# Patient Record
Sex: Female | Born: 1970 | Race: White | Hispanic: No | Smoking: Never smoker
Health system: Southern US, Community
[De-identification: ages and names within clinical notes are randomized; demographics above are authoritative.]

## PROBLEM LIST (undated history)

## (undated) DIAGNOSIS — G43909 Migraine, unspecified, not intractable, without status migrainosus: Secondary | ICD-10-CM

## (undated) DIAGNOSIS — T7840XA Allergy, unspecified, initial encounter: Secondary | ICD-10-CM

## (undated) DIAGNOSIS — R748 Abnormal levels of other serum enzymes: Secondary | ICD-10-CM

## (undated) DIAGNOSIS — R519 Headache, unspecified: Secondary | ICD-10-CM

## (undated) DIAGNOSIS — E785 Hyperlipidemia, unspecified: Secondary | ICD-10-CM

## (undated) DIAGNOSIS — R002 Palpitations: Secondary | ICD-10-CM

## (undated) HISTORY — DX: Palpitations: R00.2

## (undated) HISTORY — DX: Allergy, unspecified, initial encounter: T78.40XA

## (undated) HISTORY — DX: Abnormal levels of other serum enzymes: R74.8

## (undated) HISTORY — DX: Hyperlipidemia, unspecified: E78.5

## (undated) HISTORY — PX: LASIK: SHX215

## (undated) HISTORY — DX: Headache, unspecified: R51.9

## (undated) HISTORY — DX: Migraine, unspecified, not intractable, without status migrainosus: G43.909

## (undated) HISTORY — PX: HERNIA REPAIR: SHX51

---
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
## 2003-03-30 ENCOUNTER — Other Ambulatory Visit: Admission: RE | Admit: 2003-03-30 | Discharge: 2003-03-30 | Payer: Self-pay | Admitting: *Deleted

## 2003-03-30 ENCOUNTER — Other Ambulatory Visit: Admission: RE | Admit: 2003-03-30 | Discharge: 2003-03-30 | Payer: Self-pay | Admitting: Obstetrics and Gynecology

## 2003-04-09 ENCOUNTER — Inpatient Hospital Stay (HOSPITAL_COMMUNITY): Admission: AD | Admit: 2003-04-09 | Discharge: 2003-04-09 | Payer: Self-pay | Admitting: Obstetrics and Gynecology

## 2003-07-30 ENCOUNTER — Inpatient Hospital Stay (HOSPITAL_COMMUNITY): Admission: AD | Admit: 2003-07-30 | Discharge: 2003-07-30 | Payer: Self-pay | Admitting: Obstetrics and Gynecology

## 2003-10-05 ENCOUNTER — Encounter (INDEPENDENT_AMBULATORY_CARE_PROVIDER_SITE_OTHER): Payer: Self-pay | Admitting: Specialist

## 2003-10-05 ENCOUNTER — Inpatient Hospital Stay (HOSPITAL_COMMUNITY): Admission: AD | Admit: 2003-10-05 | Discharge: 2003-10-07 | Payer: Self-pay | Admitting: Obstetrics and Gynecology

## 2004-03-31 ENCOUNTER — Other Ambulatory Visit: Admission: RE | Admit: 2004-03-31 | Discharge: 2004-03-31 | Payer: Self-pay | Admitting: Obstetrics and Gynecology

## 2004-04-27 ENCOUNTER — Ambulatory Visit (HOSPITAL_COMMUNITY): Admission: RE | Admit: 2004-04-27 | Discharge: 2004-04-27 | Payer: Self-pay | Admitting: Surgery

## 2004-04-27 ENCOUNTER — Ambulatory Visit (HOSPITAL_BASED_OUTPATIENT_CLINIC_OR_DEPARTMENT_OTHER): Admission: RE | Admit: 2004-04-27 | Discharge: 2004-04-27 | Payer: Self-pay | Admitting: Surgery

## 2005-04-16 ENCOUNTER — Other Ambulatory Visit: Admission: RE | Admit: 2005-04-16 | Discharge: 2005-04-16 | Payer: Self-pay | Admitting: Obstetrics and Gynecology

## 2006-04-23 ENCOUNTER — Other Ambulatory Visit: Admission: RE | Admit: 2006-04-23 | Discharge: 2006-04-23 | Payer: Self-pay | Admitting: Obstetrics and Gynecology

## 2006-05-14 ENCOUNTER — Other Ambulatory Visit: Admission: RE | Admit: 2006-05-14 | Discharge: 2006-05-14 | Payer: Self-pay | Admitting: Obstetrics and Gynecology

## 2008-07-13 ENCOUNTER — Ambulatory Visit: Payer: Self-pay | Admitting: Internal Medicine

## 2008-07-13 ENCOUNTER — Encounter: Admission: RE | Admit: 2008-07-13 | Discharge: 2008-07-13 | Payer: Self-pay | Admitting: Internal Medicine

## 2009-06-28 ENCOUNTER — Ambulatory Visit: Payer: Self-pay | Admitting: Internal Medicine

## 2009-12-12 ENCOUNTER — Ambulatory Visit: Payer: Self-pay | Admitting: Internal Medicine

## 2010-06-09 ENCOUNTER — Ambulatory Visit (HOSPITAL_COMMUNITY)
Admission: RE | Admit: 2010-06-09 | Discharge: 2010-06-09 | Payer: Self-pay | Source: Home / Self Care | Attending: Psychiatry | Admitting: Psychiatry

## 2010-06-30 ENCOUNTER — Ambulatory Visit
Admission: RE | Admit: 2010-06-30 | Discharge: 2010-06-30 | Payer: Self-pay | Source: Home / Self Care | Attending: Internal Medicine | Admitting: Internal Medicine

## 2010-11-07 ENCOUNTER — Telehealth: Payer: Self-pay | Admitting: Internal Medicine

## 2010-11-07 NOTE — Telephone Encounter (Signed)
I have to leave at noon. Can we fit her in or move someone?

## 2010-11-10 ENCOUNTER — Ambulatory Visit (INDEPENDENT_AMBULATORY_CARE_PROVIDER_SITE_OTHER): Payer: 59 | Admitting: Internal Medicine

## 2010-11-10 ENCOUNTER — Encounter: Payer: Self-pay | Admitting: *Deleted

## 2010-11-10 ENCOUNTER — Encounter: Payer: Self-pay | Admitting: Internal Medicine

## 2010-11-10 VITALS — BP 114/84 | HR 76 | Temp 97.7°F | Wt 132.0 lb

## 2010-11-10 DIAGNOSIS — R002 Palpitations: Secondary | ICD-10-CM

## 2010-11-10 DIAGNOSIS — R000 Tachycardia, unspecified: Secondary | ICD-10-CM

## 2010-11-10 DIAGNOSIS — IMO0001 Reserved for inherently not codable concepts without codable children: Secondary | ICD-10-CM

## 2010-11-10 LAB — CBC WITH DIFFERENTIAL/PLATELET
Basophils Absolute: 0 10*3/uL (ref 0.0–0.1)
Basophils Relative: 1 % (ref 0–1)
Eosinophils Absolute: 0.3 10*3/uL (ref 0.0–0.7)
Eosinophils Relative: 5 % (ref 0–5)
MCH: 31 pg (ref 26.0–34.0)
MCV: 92 fL (ref 78.0–100.0)
Neutrophils Relative %: 54 % (ref 43–77)
Platelets: 367 10*3/uL (ref 150–400)
RDW: 13.5 % (ref 11.5–15.5)

## 2010-11-10 LAB — COMPREHENSIVE METABOLIC PANEL
ALT: 25 U/L (ref 0–35)
AST: 49 U/L — ABNORMAL HIGH (ref 0–37)
Alkaline Phosphatase: 95 U/L (ref 39–117)
Creat: 0.8 mg/dL (ref 0.40–1.20)
Sodium: 141 mEq/L (ref 135–145)
Total Bilirubin: 0.7 mg/dL (ref 0.3–1.2)

## 2010-11-10 LAB — TSH: TSH: 1.257 u[IU]/mL (ref 0.350–4.500)

## 2010-11-10 NOTE — Patient Instructions (Signed)
Start PPI daily for reflux. Cut back on Caffeine consumption. We will arrange for Cardiac and Neuro evals for you. Call if symptoms worsen

## 2010-11-10 NOTE — Discharge Summary (Signed)
NAMEFrancesca Mason                             ACCOUNT NO.:  [id number]   MEDICAL RECORD NO.:  [id number]                   PATIENT TYPE:  INP   LOCATION:  9123                                 FACILITY:  [facility]   PHYSICIAN:  Crist Fat. Rivard, M.D.              DATE OF BIRTH:  12-28-1970   DATE OF ADMISSION:  10/05/2003  DATE OF DISCHARGE:                                 DISCHARGE SUMMARY   ADMISSION DIAGNOSES:  1. Intrauterine pregnancy at term.  2. Spontaneous rupture of the membranes.  3. Breech presentation.  4. Positive group B streptococcus.   DISCHARGE DIAGNOSES:  1. Intrauterine pregnancy at term.  2. Spontaneous rupture of the membranes.  3. Breech presentation.  4. Positive group B streptococcus.  5. Status post primary low transverse cesarean section.  6. Breast feeding.  7. Desires oral contraceptives for birth control.   PROCEDURES THIS ADMISSION:  Primary low transverse cesarean section for  delivery of a viable female infant named Janet Mason who had Apgars of 8 and 8  and weighed 7 pounds 7 ounces, attended in delivery by Dr. Osborn Coho  and Nigel Bridgeman, C.N.M.   HOSPITAL COURSE:  Mrs. [name] is a 40 year old married white female gravida 2  para 1-0-0-1 at 48 and six-sevenths weeks who presented with ruptured  membranes with clear fluid and was found to be in breech presentation.  She  was recommended to proceed with cesarean section and underwent the same for  delivery of a viable female infant who was named Janet Mason, had Apgars of 8  and 8 and weighed 7 pounds 7 ounces, attended in delivery by Dr. Osborn Coho and Nigel Bridgeman, C.N.M.  Please see operative noted for further  details.  Postoperatively the patient is doing well.  She is ambulating,  voiding, and eating without difficulty.  Her vital signs are stable and she  is afebrile.  She is tolerating a regular diet also without difficulty.  She  is breast feeding.  She desires discharge today.   DISCHARGE INSTRUCTIONS:  As per the [facility] handout.   DISCHARGE MEDICATIONS:  1. Motrin 600 mg p.o. q.6h. p.r.n. for pain.  2. Tylox one to two p.o. q.4-6h. p.r.n. for pain.  3. Prenatal vitamins daily.  4. Micronor to start in approximately 3-4 weeks.   DISCHARGE LABORATORY DATA:  She did receive RhoGAM postpartum.  Also, her  hemoglobin is 10.7, her wbc count is 10.3, and her platelets are 221.   DISCHARGE FOLLOW-UP:  In 4-6 weeks at [facility].   DISCHARGE CONDITION:  Stable.     Concha Pyo. Duplantis, C.N.M.              Crist Fat Rivard, M.D.    SJD/MEDQ  D:  10/07/2003  T:  10/07/2003  Job:  [id number]

## 2010-11-10 NOTE — Op Note (Signed)
NAMEFrancesca Mason                 ACCOUNT NO.:  [id number]   MEDICAL RECORD NO.:  [id number]          PATIENT TYPE:  AMB   LOCATION:  DSC                          FACILITY:  [facility]   PHYSICIAN:  Velora Heckler, MD      DATE OF BIRTH:  [birth year]   DATE OF PROCEDURE:  04/27/2004  DATE OF DISCHARGE:                                 OPERATIVE REPORT   REFERRING PHYSICIAN:  Osborn Coho, M.D.   PREOPERATIVE DIAGNOSIS:  Incisional hernia.   POSTOPERATIVE DIAGNOSIS:  Incisional hernia.   PROCEDURE:  Repair incisional hernia with polyester mesh.   SURGEON:  Velora Heckler, M.D.   ANESTHESIA:  General anesthesia per Kaylyn Layer. Michelle Piper, M.D.   ESTIMATED BLOOD LOSS:  Minimal.   PREPARATION:  Betadine.   COMPLICATIONS:  None.   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  The patient first noted a bulge in the lower abdominal wall  July 2005, after resuming her exercise routine.  This gradually increased in  size.  She had minor discomfort.  She now comes to surgery for repair.   DESCRIPTION OF PROCEDURE:  Procedure is done in OR #6 at the [facility].  The patient is brought to the operating  room and placed in the supine position on the operating room table.  Following administration of general anesthesia, the patient was prepped and  draped in the usual strict aseptic fashion.  After ascertaining that an  adequate level of anesthesia had been obtained, a low midline abdominal  incision is made with a #15 blade.  Dissection is  carried down through  subcutaneous tissues and hemostasis obtained with the electrocautery.  Fascial plane is developed.  There are numerous weak, thin and herniated  areas in the abdominal midline.  These are carefully opened along  approximately an 8 cm extent.  Attenuated fascial tissue is debrided.  Adhesions to the anterior abdominal wall are taken down with the  electrocautery.  Fascia is then closed in the midline with interrupted 0  Ethibond suture.  Next, a sheet of polyester mesh is cut to the appropriate  dimensions and placed as an onlay patch onto the anterior fascia.  This is  secured circumferentially with 0 Ethibond sutures.  Good hemostasis is  noted. Good approximation of the mesh to the anterior fascia is noted.  Subcutaneous tissues are  closed with interrupted 3-0 Vicryl sutures.  Skin is closed with a running 4-  0 Vicryl subcuticular suture.  Wound is washed and dried and Benzoin and  Steri-Strips are applied.  Sterile dressings are applied.  The patient is  awakened from anesthesia and brought to the recovery room in stable  condition.  The patient tolerated the procedure well.      Todd   TMG/MEDQ  D:  04/27/2004  T:  04/27/2004  Job:  [id number]   cc:   Antony Madura, M.D.  [address]  [place] [address]  Fax: [phone]   Osborn Coho, M.D.  Fax: [phone]

## 2010-11-10 NOTE — Progress Notes (Signed)
  Subjective:    Patient ID: Janet Mason, female    DOB: 1971-03-26, 40 y.o.   MRN: [id number]  HPIDr. 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 Her general health is excellent except for some mild hyperlipidemia which is controlled by diet. She has had recently some palpitations for a few seconds, not sure if rhythm is regular or not and some tightness in her throat. Does drink about 16oz of home made [employer] coffee q am. Some history of mild reflux symptoms from time to time but not on meds for that.  Also, has noticed some odd twitches in both hands arms legs and trunk. She does do Yoga and recently has done some weight lifting. Has been somewhat concerned about some possible weakness in her shoulders compared to other yoga students. No neck pain and no radiculopathy. No family hx of MS or ALS. Father was hospitalized at age 8 with PVCs and put on a beta blocker. Some years later, he had an MI.    Review of Systems     Denies fatigue, unusual stress, does not take OTC decongestants or smoke. No neck pain. No leg weakness. Not sure if twitches are associated with any fasciculations or not. 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 They are spontaneous, not repetitive in same body part but she did note repetition in left palm. Objective:   Physical Exam   No tremors, fasciculations,twitching noted in any body part during exam. Neck supple without thyromegaly, No bruits. Chest clear. Cor RRR No Murmur, no clicks, nl S1&S2 Abd- no hepatosplenomegaly, Masses or tenderness. Ext without edema. Neuro: muscle strength 5/5 in UEs and LEs. DTRs 2+ and =. No facial assymetry. EOM's full.    Assessment & Plan:  1- Palpitations with Family Hx in father of PVCs. Today's EKG shows sinus rhythm and no ectopy. Caffeine consumption could be a factor as well as reflux. Will decrease  caffeine consumption by  half.  Cardiology evaluation.  Pt will be starting PPI for reflux symptoms. 2-Muscle twitching- maybe benign. R/O subtle demyelinating disease presentation, myositis, etc. Labs pending include:CBC, sed rate, ANA, Total CK, Cmet. Also, [facility]

## 2010-11-10 NOTE — Op Note (Signed)
NAMEFrancesca Mason                             ACCOUNT NO.:  [id number]   MEDICAL RECORD NO.:  [id number]                   PATIENT TYPE:  INP   LOCATION:  9123                                 [facility]ACILITY:  [facility]   PHYSICIAN:  Osborn Coho, M.D.                DATE OF BIRTH:  01/11/1971   DATE OF PROCEDURE:  10/05/2003  DATE OF DISCHARGE:                                 OPERATIVE REPORT   PREOPERATIVE DIAGNOSES:  1. Term intrauterine pregnancy.  2. Spontaneous rupture of membranes.  3. Breech.   POSTOPERATIVE DIAGNOSES:  1. Term intrauterine pregnancy.  2. Spontaneous rupture of membranes.  3. Breech.   PROCEDURE:  Primary low transverse cesarean section via Pfannenstiel skin  incision.   ANESTHESIA:  Spinal.   ATTENDING:  Osborn Coho, M.D.   ASSISTANT:  Renaldo Reel. Emilee Hero, C.N.M.   FLUIDS REPLACED:  1700 mL.   URINE OUTPUT:  450 mL.   ESTIMATED BLOOD LOSS:  700 mL.   COMPLICATIONS:  None.   FINDINGS:  A live female infant with Apgars of 8 at one minute, 8 at five  minutes.  Normal to palpation, normal bilateral ovaries and tubes.   PROCEDURE:  The patient was taken to the operating room after the risks,  benefits, and alternatives discussed with the patient.  Patient verbalized  understanding and consent signed and witnessed.  The patient was given a  spinal per anesthesia and prepped and draped in the normal sterile fashion.  A Pfannenstiel skin incision was made and carried down to the underlying  layer of fascia with the Bovie.  The fascia was excised bilaterally in the  midline and extended bilaterally with the Mayo scissors.  Kocher clamps were  placed on the superior aspect of the fascial incision and the rectus muscle  excised from the fascia.  The same was done on the inferior aspect of the  fascial incision.  The muscle was separated in the midline and the  peritoneum entered bluntly.  The peritoneum was retracted manually.  The  bladder blade was placed and  bladder flap created with the Metzenbaum  scissors.  The uterine incision was then made with a scalpel after replacing  the bladder blade to protect the bladder.  The uterine incision was then  extended bilaterally with the bandage scissors.  The infant's bilateral  lower extremities were immediately palpated after forewaters ruptured.  The  fluid was clear.  The infant was delivered via breech presentation without  difficulty.  The cord was clamped and cut and the infant handed to the  awaiting pediatricians.  The placenta was removed via fundal massage.  The  uterus was then cleared of all clots and debris.  The uterine incision was  repaired with 0 Vicryl in a running locked fashion.  A second imbricating  layer was performed.  The intra-abdominal cavity was irrigated and the  gutter cleared of  all clots and debris bilaterally.  By palpation the  bilateral ovaries and fallopian tubes were normal.  The uterine incision was  noted to be hemostatic.  The peritoneum was closed with 3-0 chromic in a  running fashion.  The fascia was repaired with 0 Vicryl in a running  fashion.  The subcutaneous tissue was irrigated and made hemostatic with the  Bovie.  A subcuticular stitch was placed to reapproximate the skin using 4-0  Monocryl.  Sponge, lap, and needle count was correct.  The patient tolerated  the procedure well and was returned to the recovery room in stable  condition.                                               Osborn Coho, M.D.    AR/MEDQ  D:  10/06/2003  T:  10/06/2003  Job:  [id number]

## 2010-11-10 NOTE — H&P (Signed)
NAMEFrancesca Mason                             ACCOUNT NO.:  [id number]   MEDICAL RECORD NO.:  [id number]                   PATIENT TYPE:  INP   LOCATION:  9123                                 [facility]ACILITY[facility]   PHYSICIAN:  Osborn Coho, M.D.                DATE OF BIRTH:  October 20, 1970   DATE OF ADMISSION:  10/05/2003  DATE OF DISCHARGE:                                HISTORY & PHYSICAL   HISTORY:  Ms. Janet Mason is a 40 year old, gravida 2, para 1-0-0-1 at 33 6/7  weeks who presented with spontaneous rupture of membranes at approximately 3  a.m. with clear fluid noted and irregular uterine contractions.  The cervix  has been 1 cm on her last exam.  She presented for admission at  approximately 8:30. The pregnancy has been remarkable for 1) positive group  B strep, 2) Rh negative, 3) history of cryo, 4) irregular cycles, 5) history  of polyhydramnios/pregnancy, 6) history of infertility, 7) first trimester  spotting.   PRENATAL LABS:  Blood type is O negative, Rh antibody negative, VDRL  nonreactive, rubella titer positive, hepatitis B surface antigen negative.  HIV negative, GC and chlamydia cultures are negative.  Pap was normal.  Glucose challenge was normal.  Hemoglobin upon entering the practice was  13.7, it was 11.7 at 28 weeks.  Group B strep culture was positive at 36  weeks, GC and chlamydia cultures were negative. EDC of October 20, 2003 was  established by ultrasound at approximately 8 weeks and in agreement with  ultrasound at approximately 18 weeks.   HISTORY OF PRESENT PREGNANCY:  The patient entered care at approximately 10  weeks.  She had had an ultrasound at 8 weeks secondary to questionable LMP  and irregular cycles. She had some spotting at 12 weeks and received RhoGAM.  She had an ultrasound for cervical length at 14 weeks which showed normal  length at 3.0.  Her AFP was normal.  She had another ultrasound at 19 weeks  that showed normal growth and development. She had  a normal Glucola and  received RhoGAM.  She was referred to marital counseling at 28 weeks but  denied any abuse which seemed to benefit her. She had an upper respiratory  infection at 33 weeks. She was placed on a Z-pack. The rest of her pregnancy  was essentially uncomplicated.   OBSTETRICAL HISTORY:  In 2003, she had a vaginal birth of a female infant,  weight 7 pounds 1 ounce at 40 4/7 weeks. She was in labor for 11 hours.  She  had an epidural and had to have it placed twice.  She did have  polyhydramnios and was Rh negative and received RhoGAM.  She had an abnormal  1 hour GTT but had a normal 3 hour. She have polyhydramnios with her  previous pregnancy, she had RhoGAM with her previous pregnancy. She was on  Ortho  Tri-Cyclen and Micronor in the past. She had an abnormal Pap in 1991  and had cryosurgery. She had infertility treatments prior to her previous  pregnancy. She reports the usual childhood illnesses.  She had mild anemia  in the past.  She had one UTI in the past.   PAST SURGICAL HISTORY:  Wisdom teeth removed in 1989 and LASIK surgery for  eyes in 2002. She did have ovarian cyst and hyperstimulation syndrome of  ovaries after Clomid was given in 2002 but this did not require any surgery.  She also had the previously  noted cryosurgery in 1991.   ALLERGIES:  She has no known medication allergies.   FAMILY HISTORY:  Father and first cousins have heart disease, mother and  father have hypertension. Diabetes runs in her extended family.  Maternal  grandmother has rheumatoid arthritis. Genetic history is unremarkable.   SOCIAL HISTORY:  The patient is married to the father of the baby, he is  involved and supportive.  His name is Craige Cotta. 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 She has been followed by the physician  service at [facility], she denies any alcohol,  drug or tobacco use  during this pregnancy. She did have some issues of depression and marital  discord but these were not related to domestic violence.   PHYSICAL EXAMINATION:  VITAL SIGNS:  Stable, the patient is afebrile.  HEENT:  Within normal limits.  LUNGS:  Bilateral breath sounds are clear.  HEART:  Regular rate and rhythm without murmur.  BREASTS:  Nontender.  ABDOMEN:  Fundal height is approximately 37 cm.  Estimated fetal weight is 6-  7 pounds. Uterine contractions are irregular and mild.  A speculum exam  reveals positive pooling, positive nitrazine with copious clear fluid noted.  The cervix is 1 cm, 60% with presenting part -2 plus station.  Bedside  ultrasound reveals the fetus is in a breech presentation with the fetal head  in the right upper quadrant.  Fetal heart rate is reassuring, there are some  very mild variables but variability is good and there are some accelerations  noted.  There are no light decelerations noted. No evidence of prolapsed  cord is noted or meconium.  EXTREMITIES:  Deep tendon reflexes are 2+ without clonus, there is trace  edema noted.   IMPRESSION:  1. Intrauterine pregnancy at 37 6/7 week .  2. Spontaneous rupture of membranes with minimal labor.  3. Positive group B strep.  4. Breech presentation.   PLAN:  1. Admit to birthing suite for consult with Dr. Osborn Coho as attending     physician.  2. Routine physician preop orders.  3. A cesarean section as planned with risks and benefits reviewed with the     patient and her husband including bleeding, infection and damage to other     organs. The patient and her husband do wish to proceed with cesarean     section.  This will be done with OR availability.     Renaldo Reel Emilee Hero, C.N.M.                   Osborn Coho, M.D.    VLL/MEDQ  D:  10/05/2003  T:  10/05/2003  Job:  [id number]

## 2010-11-11 LAB — CK TOTAL AND CKMB (NOT AT ARMC)
CK, MB: 6.3 ng/mL — ABNORMAL HIGH (ref 0.3–4.0)
Relative Index: 0.2 (ref 0.0–2.5)
Total CK: 2755 U/L — ABNORMAL HIGH (ref 7–177)

## 2010-11-14 LAB — ANA: Anti Nuclear Antibody(ANA): NEGATIVE

## 2010-11-21 ENCOUNTER — Telehealth: Payer: Self-pay | Admitting: Internal Medicine

## 2010-11-28 NOTE — Telephone Encounter (Signed)
Pt came in for additional labs

## 2010-12-12 ENCOUNTER — Ambulatory Visit (INDEPENDENT_AMBULATORY_CARE_PROVIDER_SITE_OTHER): Payer: 59 | Admitting: Internal Medicine

## 2010-12-12 ENCOUNTER — Encounter: Payer: Self-pay | Admitting: Internal Medicine

## 2010-12-12 VITALS — BP 113/75 | HR 60 | Resp 14 | Ht 64.0 in | Wt 133.0 lb

## 2010-12-12 DIAGNOSIS — R002 Palpitations: Secondary | ICD-10-CM | POA: Insufficient documentation

## 2010-12-12 NOTE — Progress Notes (Signed)
  HPI  Janet Mason is a 40 y.o. female Seen at the request of Dr. Lenord Fellers because of palpitations.  She has a history of hypertension and that has been described as PVCs. These were quite distinct. About a month ago she had some problems with muscle twitching and the sensation that her heart was beating fast. There was some irregularity to it. She went and had blood work drawn. This demonstrated a CK of 2700. She felt was seen for myositis as well as neurologic disease. Apparently this has all been negative. She was treated with a PPI and vacation and her symptoms have largely resolved.  She has no history of exercise intolerance.  No past medical history on file. our  No past surgical history on file.  Current Outpatient Prescriptions  Medication Sig Dispense Refill  . Calcium Carbonate (CALCIUM 500 PO) Take by mouth daily.        Marland Kitchen levonorgestrel (MIRENA) 20 MCG/24HR IUD 1 each by Intrauterine route once.        Marland Kitchen DISCONTD: Norethin Ace-Eth Estrad-FE (LOESTRIN FE 1/20 PO) Take by mouth daily.          No Known Allergies  Review of Systems negative except from HPI and PMH GE reflux and allergic diathesis  Physical Exam Well developed and well nourished Caucasian female appearing her stated age in no acute distress HENT normal E scleral and icterus clear Neck Supple JVP flat; carotids brisk and full Clear to ausculation Regular rate and rhythm, no murmurs gallops or rub Soft with active bowel sounds No clubbing cyanosis and edema Alert and oriented, grossly normal motor and sensory function Skin Warm and Dry  ECG  Assessment and  Plan

## 2010-12-12 NOTE — Assessment & Plan Note (Signed)
The patient has had 2 palpitations syndrome one has been long-standing and are consistent with PVCs the other occurred in the context of this illness characterized by muscle twitching elevated CK enzyme and had been largely resolving as her CKs come down. I not sure what this is. He asked his of exercise intolerance, however, I don't think there is any value in pursuing an echo cardiogram at this point. We will plan to see her`as needed

## 2010-12-19 ENCOUNTER — Other Ambulatory Visit: Payer: Self-pay | Admitting: Internal Medicine

## 2010-12-19 DIAGNOSIS — Z1231 Encounter for screening mammogram for malignant neoplasm of breast: Secondary | ICD-10-CM

## 2011-01-23 ENCOUNTER — Ambulatory Visit
Admission: RE | Admit: 2011-01-23 | Discharge: 2011-01-23 | Disposition: A | Discharge status: Home / Self Care | Payer: 59 | Source: Ambulatory Visit | Attending: Internal Medicine | Admitting: Internal Medicine

## 2011-01-23 DIAGNOSIS — Z1231 Encounter for screening mammogram for malignant neoplasm of breast: Secondary | ICD-10-CM

## 2011-01-24 ENCOUNTER — Encounter: Payer: Self-pay | Admitting: Internal Medicine

## 2011-05-07 ENCOUNTER — Telehealth: Payer: Self-pay | Admitting: Internal Medicine

## 2011-05-07 MED ORDER — FLUTICASONE PROPIONATE 50 MCG/ACT NA SUSP: 1.0000 | Freq: Every day | NASAL | Status: DC

## 2011-05-07 NOTE — Telephone Encounter (Signed)
Patient aware rx called to pharmacy  

## 2011-05-07 NOTE — Telephone Encounter (Signed)
Please call in generic Flonase for Dr. Charlton Haws- 2 sprays each nostril daily with prn one year refills.

## 2011-07-26 ENCOUNTER — Other Ambulatory Visit: Payer: 59 | Admitting: Internal Medicine

## 2011-07-27 ENCOUNTER — Encounter: Payer: 59 | Admitting: Internal Medicine

## 2011-08-07 ENCOUNTER — Other Ambulatory Visit: Payer: Self-pay | Admitting: Internal Medicine

## 2011-08-07 ENCOUNTER — Other Ambulatory Visit: Payer: 59 | Admitting: Internal Medicine

## 2011-08-07 DIAGNOSIS — Z0000 Encounter for general adult medical examination without abnormal findings: Secondary | ICD-10-CM

## 2011-08-07 LAB — CBC WITH DIFFERENTIAL/PLATELET
Basophils Absolute: 0 10*3/uL (ref 0.0–0.1)
Eosinophils Absolute: 0.2 10*3/uL (ref 0.0–0.7)
Eosinophils Relative: 3 % (ref 0–5)
Lymphocytes Relative: 27 % (ref 12–46)
MCV: 93.4 fL (ref 78.0–100.0)
Neutrophils Relative %: 64 % (ref 43–77)
Platelets: 302 10*3/uL (ref 150–400)
RDW: 13.1 % (ref 11.5–15.5)
WBC: 6.9 10*3/uL (ref 4.0–10.5)

## 2011-08-07 LAB — LIPID PANEL
Total CHOL/HDL Ratio: 2.6 Ratio
VLDL: 10 mg/dL (ref 0–40)

## 2011-08-07 LAB — COMPREHENSIVE METABOLIC PANEL
ALT: 12 U/L (ref 0–35)
AST: 15 U/L (ref 0–37)
Chloride: 104 mEq/L (ref 96–112)
Creat: 0.79 mg/dL (ref 0.50–1.10)
Total Bilirubin: 0.7 mg/dL (ref 0.3–1.2)

## 2011-08-07 LAB — TSH: TSH: 1.159 u[IU]/mL (ref 0.350–4.500)

## 2011-08-08 LAB — VITAMIN D 25 HYDROXY (VIT D DEFICIENCY, FRACTURES): Vit D, 25-Hydroxy: 30 ng/mL (ref 30–89)

## 2011-08-10 ENCOUNTER — Encounter: Payer: Self-pay | Admitting: Internal Medicine

## 2011-08-10 ENCOUNTER — Ambulatory Visit (INDEPENDENT_AMBULATORY_CARE_PROVIDER_SITE_OTHER): Payer: 59 | Admitting: Internal Medicine

## 2011-08-10 VITALS — BP 114/72 | HR 68 | Temp 97.6°F | Ht 64.5 in | Wt 136.0 lb

## 2011-08-10 DIAGNOSIS — Z0000 Encounter for general adult medical examination without abnormal findings: Secondary | ICD-10-CM

## 2011-08-10 LAB — POCT URINALYSIS DIPSTICK
Blood, UA: NEGATIVE
Nitrite, UA: NEGATIVE
Urobilinogen, UA: NEGATIVE
pH, UA: 6.5

## 2011-09-22 NOTE — Patient Instructions (Signed)
Return in one year or as needed. 

## 2011-09-22 NOTE — Progress Notes (Signed)
  Subjective:    Patient ID: Janet Mason, female    DOB: 12-Dec-1970, 41 y.o.   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 Has GYN physician. No real complaints or problems. No known drug allergies. Had tetanus immunization 07/13/2008. Gets annual influenza immunization. Had a C-section 2005. Incisional hernia repair 2005. Lasix eye surgery 2000. History of fractured left hand and wrist after a fall 2007. History of fractured right arm at age 61. Patient is on oral contraceptives.  Family history: Father died at age 27 of a sudden cardiac arrest. Mother with history of diabetes. 2 brothers one of which has increased lipids and hypertension.  Patient had menarche at HTN. Has gynecologist.  Patient is a nonsmoker. Social alcohol consumption. She has 2 daughters from her first marriage to an ER physician. She has subsequently remarried and is happy.  In 2011 [facility]. Patient was thought to have migraine with aura.    Review of Systems history of high HDL (good) cholesterol of 82. Otherwise noncontributory     Objective:   Physical Exam  Vitals reviewed. Constitutional: She is oriented to person, place, and time. She appears well-nourished. No distress.  HENT:  Head: Normocephalic and atraumatic.  Right Ear: External ear normal.  Left Ear: External ear normal.  Nose: Nose normal.  Mouth/Throat: Oropharynx is clear and moist. No oropharyngeal exudate.  Eyes: EOM are normal. Right eye exhibits no discharge. Left eye exhibits no discharge. No scleral icterus.  Neck: No JVD present. No thyromegaly present.  Cardiovascular: Normal rate, regular rhythm, normal heart sounds and intact distal pulses.   No murmur heard. Pulmonary/Chest: Effort normal and breath sounds normal. She has no rales.       Breasts normal female  Abdominal: Soft. Bowel sounds are normal.  She exhibits no distension and no mass. There is no tenderness. There is no rebound and no guarding.  Genitourinary:       Deferred  Musculoskeletal: Normal range of motion. She exhibits no edema.  Lymphadenopathy:    She has no cervical adenopathy.  Neurological: She is alert and oriented to person, place, and time. She has normal reflexes. No cranial nerve deficit. Coordination normal.  Skin: Skin is warm and dry. No rash noted. She is not diaphoretic.  Psychiatric: She has a normal mood and affect. Her behavior is normal. Judgment and thought content normal.          Assessment & Plan:  Normal health maintenance exam  Plan: Return in one year or as needed.

## 2011-12-31 ENCOUNTER — Other Ambulatory Visit: Payer: Self-pay | Admitting: Internal Medicine

## 2011-12-31 DIAGNOSIS — Z1231 Encounter for screening mammogram for malignant neoplasm of breast: Secondary | ICD-10-CM

## 2012-01-25 ENCOUNTER — Ambulatory Visit: Payer: 59

## 2012-02-08 ENCOUNTER — Ambulatory Visit
Admission: RE | Admit: 2012-02-08 | Discharge: 2012-02-08 | Disposition: A | Discharge status: Home / Self Care | Payer: 59 | Source: Ambulatory Visit | Attending: Internal Medicine | Admitting: Internal Medicine

## 2012-02-08 ENCOUNTER — Encounter: Payer: Self-pay | Admitting: Internal Medicine

## 2012-02-08 ENCOUNTER — Ambulatory Visit (INDEPENDENT_AMBULATORY_CARE_PROVIDER_SITE_OTHER): Payer: 59 | Admitting: Internal Medicine

## 2012-02-08 VITALS — BP 114/72 | HR 68 | Temp 97.2°F | Ht 64.5 in | Wt 133.0 lb

## 2012-02-08 DIAGNOSIS — J189 Pneumonia, unspecified organism: Secondary | ICD-10-CM

## 2012-02-08 DIAGNOSIS — Z1231 Encounter for screening mammogram for malignant neoplasm of breast: Secondary | ICD-10-CM

## 2012-02-08 MED ORDER — CEFTRIAXONE SODIUM 1 G IJ SOLR
1.0000 g | Freq: Once | INTRAMUSCULAR | Status: AC
  Administered 2012-02-08: 1 g via INTRAMUSCULAR

## 2012-02-08 NOTE — Patient Instructions (Addendum)
Take Avelox 400 mg daily with a meal. Use Advair inhaler 100/50 every 12 hours for shortness of breath as needed. Return in one week.

## 2012-02-08 NOTE — Progress Notes (Signed)
  Subjective:    Patient ID: Janet Mason, female    DOB: October 02, 1970, 41 y.o.   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 She started on Omnicef 300 mg twice daily and has been on that now for 11 doses. Has had some shortness of breath and thinks she may have had some wheezing. Says it's hard to get a deep breath. Pulse oximetry today on room air is 99%. No history of asthma. Says cough is now productive but she doesn't know color of sputum. No shaking chills. Cough has become annoying to her. Has been working this past week. Is off work today.    Review of Systems     Objective:   Physical Exam Pharynx is slightly injected without exudate; TMs are slightly full bilaterally but not red; Neck is supple without adenopathy; Chest exam: musical rales and rhonchi noted right lower lobe; cardiac exam regular rate and rhythm; skin pale warm and dry        Assessment & Plan:  Chest x-ray consistent with  right lower lobe pneumonia  Impression: Right lower lobe pneumonia  Plan: Rocephin 1 g IM. Advair 100/50 one spray every 12 hours, Avelox 400 mg daily with a meal for 6 doses. Return in one week. Advise rest at home. Plan to repeat chest x-ray in 2 weeks.

## 2012-02-15 ENCOUNTER — Ambulatory Visit (INDEPENDENT_AMBULATORY_CARE_PROVIDER_SITE_OTHER): Payer: 59 | Admitting: Internal Medicine

## 2012-02-15 ENCOUNTER — Encounter: Payer: Self-pay | Admitting: Internal Medicine

## 2012-02-15 VITALS — BP 104/68 | HR 76 | Temp 98.3°F | Ht 64.5 in | Wt 133.0 lb

## 2012-02-15 DIAGNOSIS — J189 Pneumonia, unspecified organism: Secondary | ICD-10-CM

## 2012-02-22 ENCOUNTER — Ambulatory Visit
Admission: RE | Admit: 2012-02-22 | Discharge: 2012-02-22 | Disposition: A | Discharge status: Home / Self Care | Payer: 59 | Source: Ambulatory Visit | Attending: Internal Medicine | Admitting: Internal Medicine

## 2012-02-22 DIAGNOSIS — J189 Pneumonia, unspecified organism: Secondary | ICD-10-CM

## 2012-02-23 ENCOUNTER — Encounter: Payer: Self-pay | Admitting: Internal Medicine

## 2012-02-23 NOTE — Progress Notes (Signed)
  Subjective:    Patient ID: Janet Mason, female    DOB: 1971/06/09, 41 y.o.   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 She was placed on Avelox 400 mg daily for 6 days. She's here for followup. She feels better and has returned to work. Slight fatigue. Breathing is better. Less shortness of breath. Appetite okay. No fever or shaking chills.    Review of Systems     Objective:   Physical Exam neck is supple without thyromegaly; chest clear to auscultation; cardiac exam regular rate and rhythm        Assessment & Plan:  Right lower lobe pneumonia-improved  Plan: Followup chest x-ray to be obtained today with further instructions to follow.  Addendum: Chest x-ray shows improvement in right lower lobe pneumonia but not complete clearance. She will have repeat chest x-ray in 4 weeks. I have given her an additional 7 days of Avelox 400 mg daily to take.

## 2012-02-23 NOTE — Patient Instructions (Addendum)
Take Avelox 400 mg daily for an additional 7 days. Please have followup chest x-ray done.

## 2012-02-27 NOTE — Progress Notes (Signed)
Patient aware.

## 2012-03-19 ENCOUNTER — Telehealth: Payer: Self-pay

## 2012-03-19 ENCOUNTER — Encounter (INDEPENDENT_AMBULATORY_CARE_PROVIDER_SITE_OTHER): Payer: Self-pay | Admitting: Surgery

## 2012-03-19 ENCOUNTER — Ambulatory Visit (INDEPENDENT_AMBULATORY_CARE_PROVIDER_SITE_OTHER): Payer: 59 | Admitting: Surgery

## 2012-03-19 VITALS — BP 106/70 | HR 60 | Temp 97.8°F | Resp 18 | Ht 64.0 in | Wt 131.6 lb

## 2012-03-19 DIAGNOSIS — K439 Ventral hernia without obstruction or gangrene: Secondary | ICD-10-CM

## 2012-03-19 DIAGNOSIS — K429 Umbilical hernia without obstruction or gangrene: Secondary | ICD-10-CM

## 2012-03-19 DIAGNOSIS — J189 Pneumonia, unspecified organism: Secondary | ICD-10-CM

## 2012-03-19 NOTE — Progress Notes (Signed)
General Surgery [facility]  Chief Complaint  Patient presents with  . New Evaluation    umbilical and ventral hernia - primary care is Dr. 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 Patient had previously been seen in my practice with an incisional hernia following cesarean section. This was repaired in 2005 and she has had no further difficulties. Patient has had a long-standing umbilical hernia which is largely asymptomatic but visually apparent. In the past several weeks the patient had a chronic cough associated with pneumonia. She noted a small ventral hernia a few centimeters above the level of the umbilicus in the midline. This has now reduced and remains asymptomatic.  Past Medical History  Diagnosis Date  . 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 Elevated CK      Current Outpatient Prescriptions  Medication Sig Dispense Refill  . Calcium Carbonate (CALCIUM 500 PO) Take by mouth daily.        . fluticasone (FLONASE) 50 MCG/ACT nasal spray Place 1 spray into the nose daily.  16 g  11  . levonorgestrel (MIRENA) 20 MCG/24HR IUD 1 each by Intrauterine route once.        . cefdinir (OMNICEF) 300 MG capsule Take 300 mg by mouth 2 (two) times daily.      Marland Kitchen moxifloxacin (AVELOX) 400 MG tablet Take 400 mg by mouth daily.         No Known Allergies   No family history on file.   History   Social History  . Marital Status: Married    Spouse Name: N/A    Number of Children: N/A  . Years of Education: N/A   Social History Main Topics  . Smoking status: Never Smoker   . Smokeless tobacco: None  . Alcohol Use: Yes     wine few times a week  . Drug Use: None  . Sexually Active: None   Other Topics Concern  . None   Social History Narrative  . None     REVIEW OF SYSTEMS - PERTINENT POSITIVES  ONLY: No sign or symptom of intestinal obstruction. No other significant abdominal surgery except as noted above.  EXAM: Filed Vitals:   03/19/12 0911  BP: 106/70  Pulse: 60  Temp: 97.8 F (36.6 C)  Resp: 18    HEENT: normocephalic; pupils equal and reactive; sclerae clear; dentition good; mucous membranes moist NECK:  symmetric on extension; no palpable anterior or posterior cervical lymphadenopathy; no supraclavicular masses; no tenderness CHEST: clear to auscultation bilaterally without rales, rhonchi, or wheezes CARDIAC: regular rate and rhythm without significant murmur; peripheral pulses are full ABDOMEN: soft without distension; bowel sounds present; no mass; no hepatosplenomegaly; small umbilical hernia, probably with incarcerated preperitoneal adipose tissue or omentum; scar above the level of the umbilicus consistent with piercing; no palpable ventral hernia on today's exam either in a standing or a recumbent position. EXT:  non-tender without edema; no deformity NEURO: no gross focal deficits; no sign of tremor   LABORATORY RESULTS: See [facility] (CHL-Epic) for most recent results   RADIOLOGY RESULTS: See [facility] (CHL-Epic) for most recent results   IMPRESSION: #1 small umbilical hernia, possibly with incarcerated adipose tissue, asymptomatic #2 ventral hernia by history, not readily apparent on physical examination today #3 history of incisional hernia at [facility]ann[facility]stiel incision, repaired surgically, no evidence of  recurrence  PLAN: The patient and I discussed management. I provided her with written literature to review. Certainly the defect at the umbilicus and the defect in the midline are quite small. I think her risk of incarceration or strangulation is minimal. Certainly if he should enlarger become progressively more symptomatic, they would be amenable to outpatient surgical repair with mesh patch. We discussed this.  Patient will contact me should  she desire to proceed with repair. Otherwise I think this can be safely observed for the time being.  Patient will return as needed.  Velora Heckler, MD, FACS General & Endocrine Surgery [facility]   Visit Diagnoses: 1. Umbilical hernia   2. Ventral hernia     Primary Care Physician: Margaree Mackintosh, MD

## 2012-03-19 NOTE — Patient Instructions (Signed)
Umbilical Herniorrhaphy A herniorrhaphy is surgery to repair a hernia. A hernia is a gap or weakness in the muscles of your abdomen. Umbilical means that your hernia is in the area around your belly button. You might be able to feel a small bulge in your abdomen where the hernia is. You might also have pain or discomfort. If the hernia is not repaired, the gap could get bigger. Your intestines could get trapped in the gap. This can be painful. It also can lead to other health problems, such as blocked intestines. LET YOUR CAREGIVER KNOW ABOUT:  Any allergies.   All medications you are taking, including:   Herbs, eyedrops, over-the-counter medications and cream   Blood thinners (anticoagulants), aspirin or other drugs that could affect blood clotting.   Use of steroids (by mouth or as creams).   Previous problems with anesthetics, including local anesthetics.   Possibility of pregnancy, if this applies.   Any history of blood clots.   Any history of bleeding or other blood problems.   Previous surgery.   Smoking history.   Other health problems.  RISKS AND COMPLICATIONS  Short-term possibilities include:   Pain.   Excessive bleeding.   Hematoma. This is a pooling of blood under the wound.   Infection at the surgery site or of the mesh.   Numbness at the surgery site.   Swelling and bruising.   Slow healing.   Blood clots.   Intestine or bowel damage. This is rare.   Longer-term possibilities include:   Scarring.   Skin damage.   The need for additional surgery.   Another hernia.  BEFORE THE PROCEDURE  Stop using aspirin and non-steroidal anti-inflammatory drugs (NSAIDs) for pain relief. Also stop taking vitamin E. If possible, do this two weeks in advance.   If you take blood-thinners, ask your healthcare provider when you should stop taking them.   Do not eat or drink for about 8 hours before your surgery.   You might be asked to shower or wash with a  special antibacterial soap before the procedure.   Wear clothes that will be easy to put on after the surgery.   Arrive at least an hour before the surgery, or whenever your surgeon recommends. This will give you time to check in and fill out any needed paperwork.   This surgery is usually an outpatient procedure, so you will be able to go home the same day. Less often people stay overnight in the hospital after the procedure. Ask your healthcare provider what to expect. Either way, make arrangements in advance for someone to drive you home. After an outpatient procedure, you should have someone stay with you overnight.  PROCEDURE Your procedure can be done with traditional surgery. The surgeon opens the abdomen and repairs the hernia. Or, sometimes it can be done with laparoscopic surgery. Then the procedure is done through multiple small incisions, using a camera to guide the repair. Talk with your surgeon about how the hernia will be repaired.  The preparation:   You will change into a hospital gown.   You will be given an IV. A needle will be inserted in your arm. Medication can flow directly into your body through this needle.   You might be given a sedative to help you relax.   You may be given a drug that will put you to sleep during the surgery (general anesthetic). Or, your abdomen will be numbed, and you will be drowsy but awake (local   anesthetic).   For a traditional surgery (sometimes called open surgery):   Once you are pain-free, the surgeon will make a small cut (incision) in your abdomen.   The gap in the muscle wall will be repaired. The surgeon could sew muscle together over the gap. Or, a mesh or soft screen material can be used to strengthen the area. The mesh acts as a scaffolding and the body grows new strong tissue into and around it. This new tissue is what closes the gap of the hernia and prevents it from coming back.   A drain might be put in. Fluid sometimes  collects under the wound as it heals. The drain helps get the fluid out of the area. A drain will probably be used if your hernia is large.   The surgeon will close the incision with small stitches.   For a laparoscopic surgery:   One you are pain-free, the surgeon will make a small incision in your abdomen.   A thin tube with a tiny camera attached to it will be inserted into the abdomen through the incision. What the camera "sees" is projected on a screen in the room. This gives the surgeon a good view of the hernia.   Other small incisions will be made so the surgeon can insert small tools that are used to repair the hernia.   The incisions will be closed with small stitches.  AFTER THE PROCEDURE  You will be stay in a recovery area until the anesthesia has worn off. Your blood pressure and pulse will be checked every so often.   You might be asked to get up and try walking.   Sometimes people can go home the same day. For others, an overnight stay is needed.  HOME CARE INSTRUCTIONS   Take any medication that your surgeon prescribed. Follow the directions carefully. Take all of the medication.   Ask your surgeon whether you can take over-the-counter medicines for pain, discomfort or fever. Do not take aspirin unless your healthcare team says that you should. Aspirin increases the chances of bleeding.   Do not get the incision area wet for the first few days after surgery (or until your surgeon says it is OK).   Avoid lifting heavy objects (more than 10 lbs, 4.5 kilograms) for 6 to 8 weeks after your surgery.   Expect some pain when climbing stairs for several days after surgery.   Avoid sexual activity for a few weeks. It can be uncomfortable or painful.   You should be able to drive within a few days. However, do not drive until you are no longer taking pain medicine. It can make you drowsy. It also can slow your reaction time.   You should be able to resume normal activity in  a few days. When you can return to work will depend on the type of work you do. You can go back to a desk job much sooner than you can return to work that requires physical labor. Talk about this with your healthcare provider.  SEEK MEDICAL CARE IF:   You notice blood or fluid leaking from the wound.   The area around the incision becomes red or swollen.   You are having problems urinating.   You become nauseous or throw up for more than two days after the surgery.   You develop a fever of more than 100.5 F (38.1 C).  SEEK IMMEDIATE MEDICAL CARE IF:  You develop a fever of more than   102.0 F (38.9 C). Document Released: 09/07/2008 Document Revised: 05/31/2011 Document Reviewed: 09/07/2008 [facility] Patient Information 2012 [place], [place].

## 2012-03-20 NOTE — Telephone Encounter (Signed)
Patient scheduled for followup chest xray on Friday 03/21/2012 at [facility]

## 2012-03-21 ENCOUNTER — Ambulatory Visit
Admission: RE | Admit: 2012-03-21 | Discharge: 2012-03-21 | Disposition: A | Discharge status: Home / Self Care | Payer: 59 | Source: Ambulatory Visit | Attending: Internal Medicine | Admitting: Internal Medicine

## 2012-03-21 DIAGNOSIS — J189 Pneumonia, unspecified organism: Secondary | ICD-10-CM

## 2012-03-28 ENCOUNTER — Encounter: Payer: Self-pay | Admitting: Obstetrics and Gynecology

## 2012-03-28 ENCOUNTER — Encounter (INDEPENDENT_AMBULATORY_CARE_PROVIDER_SITE_OTHER): Payer: 59 | Admitting: Obstetrics and Gynecology

## 2012-03-28 NOTE — Progress Notes (Signed)
Contraception Mirena IUD Last pap 05/25/2011 Last Mammo     Pt had to leave secondary to insurance issues.  AEX not due until Nov.

## 2012-06-03 ENCOUNTER — Ambulatory Visit: Payer: 59 | Admitting: Obstetrics and Gynecology

## 2012-06-04 ENCOUNTER — Ambulatory Visit: Payer: 59 | Admitting: Obstetrics and Gynecology

## 2012-06-10 ENCOUNTER — Encounter: Payer: Self-pay | Admitting: Obstetrics and Gynecology

## 2012-06-10 ENCOUNTER — Ambulatory Visit (INDEPENDENT_AMBULATORY_CARE_PROVIDER_SITE_OTHER): Payer: 59 | Admitting: Obstetrics and Gynecology

## 2012-06-10 VITALS — BP 106/62 | Resp 16 | Ht 64.0 in | Wt 134.0 lb

## 2012-06-10 DIAGNOSIS — Z01419 Encounter for gynecological examination (general) (routine) without abnormal findings: Secondary | ICD-10-CM

## 2012-06-10 DIAGNOSIS — Z975 Presence of (intrauterine) contraceptive device: Secondary | ICD-10-CM

## 2012-06-10 DIAGNOSIS — Z124 Encounter for screening for malignant neoplasm of cervix: Secondary | ICD-10-CM

## 2012-06-10 NOTE — Progress Notes (Addendum)
Patient ID: Janet Mason, female   DOB: [birth year], 41 y.o.   MRN: [id number] Contraception Mirena Last pap 2012 wnl Last Mammo 2013 wnl Last Colonoscopy never Last Dexa Scan never Primary MD Eden Emms [place] Abuse at Home none  No complaints  Filed Vitals:   06/10/12 0931  BP: 106/62  Resp: 16   ROS: noncontributory  Physical Examination: General appearance - alert, well appearing, and in no distress Neck - supple, no significant adenopathy Chest - clear to auscultation, no wheezes, rales or rhonchi, symmetric air entry Heart - normal rate and regular rhythm Abdomen - soft, nontender, nondistended, no masses or organomegaly Breasts - breasts appear normal, no suspicious masses, no skin or nipple changes or axillary nodes Pelvic - normal external genitalia, vulva, vagina, cervix, uterus and adnexa, string visible Back exam - no CVAT Extremities - no edema, redness or tenderness in the calves or thighs  A/P Pap today AEX 22yr Had mammo

## 2013-01-05 ENCOUNTER — Other Ambulatory Visit: Payer: 59 | Admitting: Internal Medicine

## 2013-01-05 DIAGNOSIS — Z1329 Encounter for screening for other suspected endocrine disorder: Secondary | ICD-10-CM

## 2013-01-05 DIAGNOSIS — Z13220 Encounter for screening for lipoid disorders: Secondary | ICD-10-CM

## 2013-01-05 DIAGNOSIS — Z0000 Encounter for general adult medical examination without abnormal findings: Secondary | ICD-10-CM

## 2013-01-05 DIAGNOSIS — Z130 Encounter for screening for diseases of the blood and blood-forming organs and certain disorders involving the immune mechanism: Secondary | ICD-10-CM

## 2013-01-05 LAB — COMPREHENSIVE METABOLIC PANEL
Albumin: 4.9 g/dL (ref 3.5–5.2)
CO2: 29 mEq/L (ref 19–32)
Calcium: 9.6 mg/dL (ref 8.4–10.5)
Chloride: 101 mEq/L (ref 96–112)
Glucose, Bld: 80 mg/dL (ref 70–99)
Sodium: 139 mEq/L (ref 135–145)
Total Bilirubin: 0.8 mg/dL (ref 0.3–1.2)
Total Protein: 7.1 g/dL (ref 6.0–8.3)

## 2013-01-05 LAB — TSH: TSH: 1.597 u[IU]/mL (ref 0.350–4.500)

## 2013-01-05 LAB — CBC WITH DIFFERENTIAL/PLATELET
Eosinophils Absolute: 0.2 10*3/uL (ref 0.0–0.7)
Eosinophils Relative: 3 % (ref 0–5)
Hemoglobin: 13.6 g/dL (ref 12.0–15.0)
Lymphs Abs: 2 10*3/uL (ref 0.7–4.0)
MCH: 30.2 pg (ref 26.0–34.0)
MCV: 91.8 fL (ref 78.0–100.0)
Monocytes Relative: 6 % (ref 3–12)
Neutrophils Relative %: 60 % (ref 43–77)
RBC: 4.5 MIL/uL (ref 3.87–5.11)

## 2013-01-05 LAB — LIPID PANEL
HDL: 63 mg/dL (ref >39)
LDL Cholesterol: 115 mg/dL — ABNORMAL HIGH (ref 0–99)
Total CHOL/HDL Ratio: 3 Ratio
VLDL: 12 mg/dL (ref 0–40)

## 2013-01-06 ENCOUNTER — Ambulatory Visit (INDEPENDENT_AMBULATORY_CARE_PROVIDER_SITE_OTHER): Payer: 59 | Admitting: Internal Medicine

## 2013-01-06 ENCOUNTER — Encounter: Payer: Self-pay | Admitting: Internal Medicine

## 2013-01-06 ENCOUNTER — Other Ambulatory Visit: Payer: Self-pay

## 2013-01-06 VITALS — BP 104/68 | HR 80 | Temp 98.3°F | Ht 65.0 in | Wt 133.0 lb

## 2013-01-06 DIAGNOSIS — Z0000 Encounter for general adult medical examination without abnormal findings: Secondary | ICD-10-CM

## 2013-01-06 DIAGNOSIS — Z8669 Personal history of other diseases of the nervous system and sense organs: Secondary | ICD-10-CM

## 2013-01-06 DIAGNOSIS — Z1231 Encounter for screening mammogram for malignant neoplasm of breast: Secondary | ICD-10-CM

## 2013-01-06 DIAGNOSIS — Z789 Other specified health status: Secondary | ICD-10-CM

## 2013-01-06 DIAGNOSIS — Z8719 Personal history of other diseases of the digestive system: Secondary | ICD-10-CM

## 2013-01-06 LAB — POCT URINALYSIS DIPSTICK
Blood, UA: NEGATIVE
Glucose, UA: NEGATIVE
Spec Grav, UA: 1.01
Urobilinogen, UA: NEGATIVE
pH, UA: 6.5

## 2013-01-06 LAB — VITAMIN D 25 HYDROXY (VIT D DEFICIENCY, FRACTURES): Vit D, 25-Hydroxy: 37 ng/mL (ref 30–89)

## 2013-01-06 NOTE — Patient Instructions (Addendum)
It was a pleasure to see you. Return in one year.

## 2013-02-10 ENCOUNTER — Ambulatory Visit
Admission: RE | Admit: 2013-02-10 | Discharge: 2013-02-10 | Disposition: A | Discharge status: Home / Self Care | Payer: 59 | Source: Ambulatory Visit

## 2013-02-10 DIAGNOSIS — Z1231 Encounter for screening mammogram for malignant neoplasm of breast: Secondary | ICD-10-CM

## 2013-03-30 NOTE — Progress Notes (Signed)
  Subjective:    Patient ID: Janet Mason, female    DOB: Jul 29, 1970, 42 y.o.   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 She has a history of small umbilical hernia seen by Dr. Gerrit Friends in 2013. He felt that she should just be observed. History of incisional hernia status post cesarean section which was repaired surgically. Had cesarean section in 2005. Incisional hernia repair occurred in 2005.  She has no known drug allergies.  History of Lasik eye surgery 2000  History of fractured left hand and wrist after a fall in 2007. History of fractured right arm at age 42.  History of right lower lobe pneumonia August 2013  Tetanus immunization done 07/13/2008.  Gets annual influenza immunization.  In 2011 she saw Dr. Darrow Bussing at [facility] for evaluation of headaches. She was thought to have migraine with aura.  Social history: Patient is a nonsmoker. Social alcohol consumption. She has 2 daughters from her first marriage. She subsequently remarried and is quite happy.  Family history: Father died at age 71 of a sudden cardiac arrest. Mother with history of diabetes. 2 brothers, one of which has increased lipids and hypertension.    Review of Systems  HENT: Negative.   Respiratory: Negative.   Cardiovascular: Negative.   Gastrointestinal: Negative.   Endocrine: Negative.   Genitourinary: Negative.   Allergic/Immunologic: Negative.   Neurological: Negative.   Hematological: Negative.   Psychiatric/Behavioral: Negative.   All other systems reviewed and are negative.       Objective:   Physical Exam  Vitals reviewed. Constitutional: She is oriented to person, place, and time. She appears well-developed and well-nourished. No distress.  HENT:  Head: Normocephalic and atraumatic.  Right Ear: External ear normal.  Left Ear: External ear normal.  Mouth/Throat: No oropharyngeal exudate.  Eyes:  Conjunctivae and EOM are normal. Pupils are equal, round, and reactive to light. Left eye exhibits no discharge. No scleral icterus.  Neck: Neck supple. No JVD present. No thyromegaly present.  Cardiovascular: Normal rate, regular rhythm and intact distal pulses.   No murmur heard. Pulmonary/Chest: Effort normal and breath sounds normal. She has no wheezes. She has no rales. She exhibits no tenderness.  Breasts normal female  Abdominal: Soft. Bowel sounds are normal. She exhibits no distension and no mass. There is no tenderness. There is no rebound and no guarding.  Genitourinary:  Deferred to GYN  Musculoskeletal: Normal range of motion. She exhibits no edema.  Lymphadenopathy:    She has no cervical adenopathy.  Neurological: She is alert and oriented to person, place, and time. She has normal reflexes. No cranial nerve deficit. Coordination normal.  Skin: Skin is warm and dry. No rash noted. She is not diaphoretic.  Psychiatric: She has a normal mood and affect. Her behavior is normal. Judgment and thought content normal.          Assessment & Plan:  Normal health maintenance exam  History of migraine headaches  History of umbilical hernia  Plan: Return in one year or as needed. LDL cholesterol mildly elevated at 115. Continue diet and exercise.

## 2013-04-30 ENCOUNTER — Other Ambulatory Visit: Payer: Self-pay

## 2014-01-11 ENCOUNTER — Other Ambulatory Visit: Payer: 59 | Admitting: Internal Medicine

## 2014-01-11 DIAGNOSIS — Z13220 Encounter for screening for lipoid disorders: Secondary | ICD-10-CM

## 2014-01-11 DIAGNOSIS — Z130 Encounter for screening for diseases of the blood and blood-forming organs and certain disorders involving the immune mechanism: Secondary | ICD-10-CM

## 2014-01-11 DIAGNOSIS — Z0000 Encounter for general adult medical examination without abnormal findings: Secondary | ICD-10-CM

## 2014-01-11 LAB — TSH: TSH: 1.732 u[IU]/mL (ref 0.350–4.500)

## 2014-01-11 LAB — COMPREHENSIVE METABOLIC PANEL
ALBUMIN: 4.3 g/dL (ref 3.5–5.2)
ALK PHOS: 85 U/L (ref 39–117)
ALT: 12 U/L (ref 0–35)
AST: 16 U/L (ref 0–37)
BUN: 16 mg/dL (ref 6–23)
CALCIUM: 9 mg/dL (ref 8.4–10.5)
CHLORIDE: 101 meq/L (ref 96–112)
CO2: 26 mEq/L (ref 19–32)
Creat: 0.76 mg/dL (ref 0.50–1.10)
Glucose, Bld: 83 mg/dL (ref 70–99)
POTASSIUM: 4.1 meq/L (ref 3.5–5.3)
SODIUM: 135 meq/L (ref 135–145)
TOTAL PROTEIN: 6.8 g/dL (ref 6.0–8.3)
Total Bilirubin: 0.8 mg/dL (ref 0.2–1.2)

## 2014-01-11 LAB — CBC WITH DIFFERENTIAL/PLATELET
BASOS ABS: 0.1 10*3/uL (ref 0.0–0.1)
Basophils Relative: 1 % (ref 0–1)
Eosinophils Absolute: 0.3 10*3/uL (ref 0.0–0.7)
Eosinophils Relative: 5 % (ref 0–5)
HEMATOCRIT: 39.4 % (ref 36.0–46.0)
HEMOGLOBIN: 13.4 g/dL (ref 12.0–15.0)
LYMPHS PCT: 36 % (ref 12–46)
Lymphs Abs: 1.9 10*3/uL (ref 0.7–4.0)
MCH: 30.8 pg (ref 26.0–34.0)
MCHC: 34 g/dL (ref 30.0–36.0)
MCV: 90.6 fL (ref 78.0–100.0)
MONO ABS: 0.4 10*3/uL (ref 0.1–1.0)
Monocytes Relative: 7 % (ref 3–12)
NEUTROS ABS: 2.7 10*3/uL (ref 1.7–7.7)
NEUTROS PCT: 51 % (ref 43–77)
Platelets: 302 10*3/uL (ref 150–400)
RBC: 4.35 MIL/uL (ref 3.87–5.11)
RDW: 13.1 % (ref 11.5–15.5)
WBC: 5.2 10*3/uL (ref 4.0–10.5)

## 2014-01-11 LAB — LIPID PANEL
Cholesterol: 161 mg/dL (ref 0–200)
HDL: 68 mg/dL (ref >39)
LDL CALC: 83 mg/dL (ref 0–99)
Total CHOL/HDL Ratio: 2.4 Ratio
Triglycerides: 49 mg/dL (ref <150)
VLDL: 10 mg/dL (ref 0–40)

## 2014-01-12 ENCOUNTER — Encounter: Payer: Self-pay | Admitting: Internal Medicine

## 2014-01-12 ENCOUNTER — Other Ambulatory Visit: Payer: Self-pay

## 2014-01-12 ENCOUNTER — Ambulatory Visit (INDEPENDENT_AMBULATORY_CARE_PROVIDER_SITE_OTHER): Payer: 59 | Admitting: Internal Medicine

## 2014-01-12 VITALS — BP 104/64 | HR 64 | Ht 65.0 in | Wt 128.5 lb

## 2014-01-12 DIAGNOSIS — Z1231 Encounter for screening mammogram for malignant neoplasm of breast: Secondary | ICD-10-CM

## 2014-01-12 DIAGNOSIS — Z0000 Encounter for general adult medical examination without abnormal findings: Secondary | ICD-10-CM

## 2014-01-12 LAB — POCT URINALYSIS DIPSTICK
Bilirubin, UA: NEGATIVE
Blood, UA: NEGATIVE
Glucose, UA: NEGATIVE
Ketones, UA: NEGATIVE
Leukocytes, UA: NEGATIVE
Nitrite, UA: NEGATIVE
PH UA: 6.5
PROTEIN UA: NEGATIVE
SPEC GRAV UA: 1.01
Urobilinogen, UA: NEGATIVE

## 2014-01-12 LAB — VITAMIN D 25 HYDROXY (VIT D DEFICIENCY, FRACTURES): Vit D, 25-Hydroxy: 36 ng/mL (ref 30–89)

## 2014-01-12 NOTE — Progress Notes (Signed)
   Subjective:    Patient ID: Janet Mason, female    DOB: Dec 02, 1970, 43 y.o.   MRN: [id number]  HPI Very pleasant 43 year old White Female in today for health maintenance exam. No complaints or problems. Feels well. Screening labs are within normal limits including lipid panel. His been playing golf 3 times a week. Weight is excellent. Order place for screening mammogram.   Past medical history: No known drug allergies.  History of Lasik eye surgery 2000. History of fractured left hand and wrist after a fall in 2007. Had fractured right arm at age 45. Right lower lobe pneumonia August 2013. Small umbilical hernia seen by Dr. Harlow Asa in 2013 which he felt should just be observed. History of incisional hernia repair in 2005 status post cesarean section in 2005.  Tetanus immunization done 07/13/2008.  Gets annual influenza vaccine through employment.  In 2011, she saw Dr. Claudie Fisherman at the [facility] for evaluation of headaches. Was thought to have migraine with aura.  Social history: Patient is a nonsmoker. Social alcohol consumption. Has 2 daughters from a previous marriage who are healthy. She has subsequently remarried and is quite happy.  Family history: Father died at age 12 of sudden cardiac arrest. Mother with history of diabetes. 2 brothers one of which has increased lipids and hypertension.    Review of Systems  Constitutional: Negative for fever, chills, diaphoresis, activity change, appetite change, fatigue and unexpected weight change.  Respiratory: Negative.   Cardiovascular: Negative.   Gastrointestinal: Negative.   Neurological:       Remote history of migraine  All other systems reviewed and are negative.      Objective:   Physical Exam  Vitals reviewed. Constitutional: She is oriented to person, place, and time. She appears well-developed and well-nourished. No distress.  HENT:  Head: Normocephalic and atraumatic.  Right Ear: External ear  normal.  Left Ear: External ear normal.  Mouth/Throat: Oropharynx is clear and moist. No oropharyngeal exudate.  Eyes: Conjunctivae are normal. Pupils are equal, round, and reactive to light. Right eye exhibits no discharge. Left eye exhibits no discharge. No scleral icterus.  Neck: Neck supple. No JVD present. No thyromegaly present.  Cardiovascular: Normal rate, regular rhythm, normal heart sounds and intact distal pulses.  Exam reveals no gallop.   No murmur heard. Pulmonary/Chest: Effort normal and breath sounds normal. No respiratory distress. She has no wheezes. She has no rales. She exhibits no tenderness.  Breasts normal female  Abdominal: Soft. Bowel sounds are normal. She exhibits no distension and no mass. There is no tenderness. There is no rebound and no guarding.  Genitourinary:  Deferred to GYN  Musculoskeletal: Normal range of motion. She exhibits no edema.  Lymphadenopathy:    She has no cervical adenopathy.  Neurological: She is alert and oriented to person, place, and time. She has normal reflexes. No cranial nerve deficit. Coordination normal.  Skin: Skin is warm and dry. No rash noted. She is not diaphoretic.  Psychiatric: She has a normal mood and affect. Her behavior is normal. Judgment and thought content normal.          Assessment & Plan:  Well female  Plan: Return in one year or as needed. Has annual influenza immunization at work. or place for mammogram. Lab work is within normal limits

## 2014-01-12 NOTE — Patient Instructions (Addendum)
Have mammogram . Return in one year. It was a pleasure to see you today.

## 2014-02-12 ENCOUNTER — Ambulatory Visit
Admission: RE | Admit: 2014-02-12 | Discharge: 2014-02-12 | Disposition: A | Discharge status: Home / Self Care | Payer: 59 | Source: Ambulatory Visit

## 2014-02-12 DIAGNOSIS — Z1231 Encounter for screening mammogram for malignant neoplasm of breast: Secondary | ICD-10-CM

## 2014-04-26 ENCOUNTER — Encounter: Payer: Self-pay | Admitting: Internal Medicine

## 2015-01-18 ENCOUNTER — Other Ambulatory Visit: Payer: 59 | Admitting: Internal Medicine

## 2015-01-18 DIAGNOSIS — Z1321 Encounter for screening for nutritional disorder: Secondary | ICD-10-CM

## 2015-01-18 DIAGNOSIS — Z0000 Encounter for general adult medical examination without abnormal findings: Secondary | ICD-10-CM

## 2015-01-18 DIAGNOSIS — Z13220 Encounter for screening for lipoid disorders: Secondary | ICD-10-CM

## 2015-01-18 DIAGNOSIS — Z130 Encounter for screening for diseases of the blood and blood-forming organs and certain disorders involving the immune mechanism: Secondary | ICD-10-CM

## 2015-01-18 DIAGNOSIS — Z1329 Encounter for screening for other suspected endocrine disorder: Secondary | ICD-10-CM

## 2015-01-18 LAB — CBC WITH DIFFERENTIAL/PLATELET
Basophils Absolute: 0.1 10*3/uL (ref 0.0–0.1)
Basophils Relative: 1 % (ref 0–1)
EOS ABS: 0.3 10*3/uL (ref 0.0–0.7)
Eosinophils Relative: 6 % — ABNORMAL HIGH (ref 0–5)
HCT: 40.3 % (ref 36.0–46.0)
Hemoglobin: 13.6 g/dL (ref 12.0–15.0)
LYMPHS ABS: 1.5 10*3/uL (ref 0.7–4.0)
Lymphocytes Relative: 29 % (ref 12–46)
MCH: 31.3 pg (ref 26.0–34.0)
MCHC: 33.7 g/dL (ref 30.0–36.0)
MCV: 92.6 fL (ref 78.0–100.0)
MPV: 9.2 fL (ref 8.6–12.4)
Monocytes Absolute: 0.5 10*3/uL (ref 0.1–1.0)
Monocytes Relative: 9 % (ref 3–12)
Neutro Abs: 2.9 10*3/uL (ref 1.7–7.7)
Neutrophils Relative %: 55 % (ref 43–77)
Platelets: 297 10*3/uL (ref 150–400)
RBC: 4.35 MIL/uL (ref 3.87–5.11)
RDW: 13.2 % (ref 11.5–15.5)
WBC: 5.2 10*3/uL (ref 4.0–10.5)

## 2015-01-18 LAB — LIPID PANEL
CHOL/HDL RATIO: 2.4 ratio (ref <=5.0)
Cholesterol: 179 mg/dL (ref 125–200)
HDL: 75 mg/dL (ref >=46)
LDL CALC: 91 mg/dL (ref <130)
Triglycerides: 65 mg/dL (ref <150)
VLDL: 13 mg/dL (ref <30)

## 2015-01-18 LAB — COMPLETE METABOLIC PANEL WITH GFR
ALK PHOS: 78 U/L (ref 33–115)
ALT: 12 U/L (ref 6–29)
AST: 15 U/L (ref 10–30)
Albumin: 4.6 g/dL (ref 3.6–5.1)
BUN: 13 mg/dL (ref 7–25)
CO2: 27 mEq/L (ref 20–31)
Calcium: 9.6 mg/dL (ref 8.6–10.2)
Chloride: 101 mEq/L (ref 98–110)
Creat: 0.74 mg/dL (ref 0.50–1.10)
GFR, Est African American: >89 mL/min (ref >=60)
GFR, Est Non African American: >89 mL/min (ref >=60)
Glucose, Bld: 79 mg/dL (ref 65–99)
Potassium: 4.3 mEq/L (ref 3.5–5.3)
Sodium: 139 mEq/L (ref 135–146)
Total Bilirubin: 0.9 mg/dL (ref 0.2–1.2)
Total Protein: 6.8 g/dL (ref 6.1–8.1)

## 2015-01-18 LAB — TSH: TSH: 1.645 u[IU]/mL (ref 0.350–4.500)

## 2015-01-19 LAB — VITAMIN D 25 HYDROXY (VIT D DEFICIENCY, FRACTURES): Vit D, 25-Hydroxy: 23 ng/mL — ABNORMAL LOW (ref 30–100)

## 2015-01-21 ENCOUNTER — Ambulatory Visit (INDEPENDENT_AMBULATORY_CARE_PROVIDER_SITE_OTHER): Payer: 59 | Admitting: Internal Medicine

## 2015-01-21 ENCOUNTER — Encounter: Payer: Self-pay | Admitting: Internal Medicine

## 2015-01-21 VITALS — BP 106/62 | HR 73 | Temp 97.9°F | Ht 65.0 in | Wt 129.0 lb

## 2015-01-21 DIAGNOSIS — E559 Vitamin D deficiency, unspecified: Secondary | ICD-10-CM | POA: Diagnosis not present

## 2015-01-21 DIAGNOSIS — Z0000 Encounter for general adult medical examination without abnormal findings: Secondary | ICD-10-CM | POA: Diagnosis not present

## 2015-01-21 LAB — POCT URINALYSIS DIPSTICK
Bilirubin, UA: NEGATIVE
Glucose, UA: NEGATIVE
Ketones, UA: NEGATIVE
Leukocytes, UA: NEGATIVE
Nitrite, UA: NEGATIVE
PROTEIN UA: NEGATIVE
RBC UA: NEGATIVE
Spec Grav, UA: >=1.03
Urobilinogen, UA: NEGATIVE
pH, UA: 6

## 2015-01-21 NOTE — Patient Instructions (Signed)
Take 2000 units vitamin D 3 daily. Return in one year or as needed. Have annual mammogram.

## 2015-01-21 NOTE — Progress Notes (Signed)
   Subjective:    Patient ID: Janet Mason, female    DOB: 1971-02-25, 44 y.o.   MRN: [id number]  HPI  44 year old White Female in today for health maintenance exam. Feels well for no complaints. Has GYN physician. Has IUD which will need to be replaced later this year. Order given for screening mammography.  No known drug allergies.  Past medical history: Had Lasik surgery in 2000. History of fractured left hand and wrist after a fall in 2007. Fractured right arm at age 49. Right lower lobe pneumonia August 2013. Small umbilical hernia seen by Dr. Harlow Asa in 2013 which he felt should just be observed. History of incisional hernia repair in 2005 status post C- section in 2005.  Gets annual flu vaccine through employment.  Tetanus immunization up-to-date having been done January 2010.  In 2011, she saw Dr. Claudie Fisherman at [facility] for evaluation of headaches. Was thought to have migraine with aura. This improved with discontinuation of oral contraceptives  Social history: Patient is married. Patient is a nonsmoker. Social alcohol consumption. 2 daughters from a previous marriage who are healthy. Joslyn Hy going off to college at [employer] this Fall.  Family history: Father died at age 30 of sudden cardiac arrest. Mother with history of diabetes. 2 brothers, one of them has increased lipids and hypertension.    Review of Systems  Constitutional: Negative.   All other systems reviewed and are negative.      Objective:   Physical Exam  Constitutional: She is oriented to person, place, and time. She appears well-developed and well-nourished. No distress.  HENT:  Head: Normocephalic and atraumatic.  Right Ear: External ear normal.  Left Ear: External ear normal.  Mouth/Throat: Oropharynx is clear and moist. No oropharyngeal exudate.  Eyes: Conjunctivae are normal. Pupils are equal, round, and reactive to light. Right eye exhibits no discharge. Left eye  exhibits no discharge. No scleral icterus.  Neck: Neck supple. No JVD present. No thyromegaly present.  Cardiovascular: Normal rate, regular rhythm, normal heart sounds and intact distal pulses.   No murmur heard. Pulmonary/Chest: Effort normal and breath sounds normal. No respiratory distress. She has no wheezes. She has no rales.  Breasts normal female without masses  Abdominal: Soft. Bowel sounds are normal. She exhibits no distension and no mass. There is no tenderness. There is no rebound and no guarding.  Genitourinary:  Deferred to GYN  Musculoskeletal: Normal range of motion. She exhibits no edema.  Lymphadenopathy:    She has no cervical adenopathy.  Neurological: She is alert and oriented to person, place, and time. She has normal reflexes. No cranial nerve deficit. Coordination normal.  Skin: Skin is warm and dry. No rash noted. She is not diaphoretic. No erythema.  Psychiatric: She has a normal mood and affect. Her behavior is normal. Judgment and thought content normal.  Vitals reviewed.         Assessment & Plan:  Normal health maintenance exam  Vitamin D deficiency-vitamin D level low at 23. Recommend 2000 units vitamin D 3 daily. She does take calcium with vitamin D supplement already but suspect this is not enough.  Plan: Mammogram order placed. Return in one year or as needed.

## 2015-01-24 ENCOUNTER — Other Ambulatory Visit: Payer: Self-pay | Admitting: Internal Medicine

## 2015-01-24 DIAGNOSIS — Z1231 Encounter for screening mammogram for malignant neoplasm of breast: Secondary | ICD-10-CM

## 2015-03-09 ENCOUNTER — Ambulatory Visit: Payer: Self-pay

## 2015-03-11 ENCOUNTER — Ambulatory Visit
Admission: RE | Admit: 2015-03-11 | Discharge: 2015-03-11 | Disposition: A | Discharge status: Home / Self Care | Payer: 59 | Source: Ambulatory Visit | Attending: Internal Medicine | Admitting: Internal Medicine

## 2015-03-11 DIAGNOSIS — Z1231 Encounter for screening mammogram for malignant neoplasm of breast: Secondary | ICD-10-CM

## 2015-08-17 ENCOUNTER — Other Ambulatory Visit: Payer: Self-pay | Admitting: Obstetrics and Gynecology

## 2015-08-17 ENCOUNTER — Ambulatory Visit
Admission: RE | Admit: 2015-08-17 | Discharge: 2015-08-17 | Disposition: A | Discharge status: Home / Self Care | Payer: 59 | Source: Ambulatory Visit | Attending: Obstetrics and Gynecology | Admitting: Obstetrics and Gynecology

## 2015-08-17 DIAGNOSIS — R0689 Other abnormalities of breathing: Secondary | ICD-10-CM

## 2016-01-20 ENCOUNTER — Other Ambulatory Visit: Payer: 59 | Admitting: Internal Medicine

## 2016-01-20 DIAGNOSIS — Z0000 Encounter for general adult medical examination without abnormal findings: Secondary | ICD-10-CM

## 2016-01-20 DIAGNOSIS — Z13220 Encounter for screening for lipoid disorders: Secondary | ICD-10-CM

## 2016-01-20 DIAGNOSIS — Z130 Encounter for screening for diseases of the blood and blood-forming organs and certain disorders involving the immune mechanism: Secondary | ICD-10-CM

## 2016-01-20 DIAGNOSIS — Z1321 Encounter for screening for nutritional disorder: Secondary | ICD-10-CM

## 2016-01-20 DIAGNOSIS — Z1329 Encounter for screening for other suspected endocrine disorder: Secondary | ICD-10-CM

## 2016-01-20 LAB — LIPID PANEL
Cholesterol: 189 mg/dL (ref 125–200)
HDL: 75 mg/dL (ref >=46)
LDL CALC: 105 mg/dL (ref <130)
TRIGLYCERIDES: 47 mg/dL (ref <150)
Total CHOL/HDL Ratio: 2.5 Ratio (ref <=5.0)
VLDL: 9 mg/dL (ref <30)

## 2016-01-20 LAB — COMPLETE METABOLIC PANEL WITH GFR
ALT: 10 U/L (ref 6–29)
AST: 14 U/L (ref 10–35)
Albumin: 4.3 g/dL (ref 3.6–5.1)
Alkaline Phosphatase: 73 U/L (ref 33–115)
BUN: 15 mg/dL (ref 7–25)
CHLORIDE: 104 mmol/L (ref 98–110)
CO2: 26 mmol/L (ref 20–31)
Calcium: 9.1 mg/dL (ref 8.6–10.2)
Creat: 0.78 mg/dL (ref 0.50–1.10)
Glucose, Bld: 88 mg/dL (ref 65–99)
POTASSIUM: 4.4 mmol/L (ref 3.5–5.3)
Sodium: 139 mmol/L (ref 135–146)
Total Bilirubin: 0.5 mg/dL (ref 0.2–1.2)
Total Protein: 6.6 g/dL (ref 6.1–8.1)

## 2016-01-20 LAB — CBC WITH DIFFERENTIAL/PLATELET
BASOS PCT: 0 %
Basophils Absolute: 0 cells/uL (ref 0–200)
Eosinophils Absolute: 315 cells/uL (ref 15–500)
Eosinophils Relative: 7 %
HEMATOCRIT: 40.3 % (ref 35.0–45.0)
HEMOGLOBIN: 13.2 g/dL (ref 11.7–15.5)
LYMPHS ABS: 1530 {cells}/uL (ref 850–3900)
Lymphocytes Relative: 34 %
MCH: 30.8 pg (ref 27.0–33.0)
MCHC: 32.8 g/dL (ref 32.0–36.0)
MCV: 94.2 fL (ref 80.0–100.0)
MONO ABS: 270 {cells}/uL (ref 200–950)
MPV: 9.5 fL (ref 7.5–12.5)
Monocytes Relative: 6 %
Neutro Abs: 2385 cells/uL (ref 1500–7800)
Neutrophils Relative %: 53 %
Platelets: 273 10*3/uL (ref 140–400)
RBC: 4.28 MIL/uL (ref 3.80–5.10)
RDW: 13.2 % (ref 11.0–15.0)
WBC: 4.5 10*3/uL (ref 3.8–10.8)

## 2016-01-20 LAB — TSH: TSH: 1.04 mIU/L

## 2016-01-21 LAB — VITAMIN D 25 HYDROXY (VIT D DEFICIENCY, FRACTURES): Vit D, 25-Hydroxy: 38 ng/mL (ref 30–100)

## 2016-01-24 ENCOUNTER — Encounter: Payer: Self-pay | Admitting: Internal Medicine

## 2016-01-24 ENCOUNTER — Ambulatory Visit (INDEPENDENT_AMBULATORY_CARE_PROVIDER_SITE_OTHER): Payer: 59 | Admitting: Internal Medicine

## 2016-01-24 VITALS — BP 114/72 | HR 74 | Temp 98.2°F | Ht 65.0 in | Wt 133.0 lb

## 2016-01-24 DIAGNOSIS — Z0000 Encounter for general adult medical examination without abnormal findings: Secondary | ICD-10-CM

## 2016-01-24 LAB — POCT URINALYSIS DIPSTICK
BILIRUBIN UA: NEGATIVE
GLUCOSE UA: NEGATIVE
KETONES UA: NEGATIVE
NITRITE UA: NEGATIVE
Protein, UA: NEGATIVE
Spec Grav, UA: 1.01
Urobilinogen, UA: 0.2
pH, UA: 7.5

## 2016-01-24 NOTE — Progress Notes (Signed)
   Subjective:    Patient ID: Janet Mason, female    DOB: January 29, 1971, 45 y.o.   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 Her general health is excellent. She has no new complaints.  No known drug allergies  Past medical history: Had Lasik surgery in 2000. History of fractured left hand and wrist after a fall in 2007. Fractured right arm at age 59. Right lower lobe pneumonia August 2013. Small umbilical hernia seen by Dr. Lynford Humphrey in 2013 which he felt should just be observed. History of incisional hernia repair 2005 status post C-section in 2005.  Gets annual flu vaccine through employment.  Tetanus immunization up-to-date having been done in January 2010.  In 2011, she saw Dr. Claudie [facility]man [facility] for evaluation of headaches. Was thought to have migraine with aura. This improved with discontinuation of oral contraceptives.  Social history: She is married. She is a nonsmoker. Social alcohol consumption. 2 daughters from a previous marriage who are healthy. 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 Mother with history of diabetes. 2 brothers, one of them has increased lipids and hypertension.    Review of Systems  Constitutional: Negative.   All other systems reviewed and are negative.      Objective:   Physical Exam  Constitutional: She is oriented to person, place, and time. She appears well-developed and well-nourished. No distress.  HENT:  Head: Normocephalic and atraumatic.  Right Ear: External ear normal.  Left Ear: External ear normal.  Mouth/Throat: Oropharynx is clear and moist. No oropharyngeal exudate.  Eyes: Conjunctivae and EOM are normal. Pupils are equal, round, and reactive to light. Right eye exhibits no discharge. Left eye exhibits no discharge. No scleral  icterus.  Neck: Neck supple. No JVD present. No thyromegaly present.  Cardiovascular: Normal rate, regular rhythm, normal heart sounds and intact distal pulses.   No murmur heard. Pulmonary/Chest: Effort normal and breath sounds normal. No respiratory distress. She has no wheezes. She has no rales. She exhibits no tenderness.  Fibrocystic breast disease especially left breast  Abdominal: Soft. Bowel sounds are normal. She exhibits no distension and no mass. There is no tenderness. There is no rebound and no guarding.  Genitourinary:  Genitourinary Comments: Deferred to GYN. She has a Mirena IUD  Musculoskeletal: She exhibits no edema.  Lymphadenopathy:    She has no cervical adenopathy.  Neurological: She is alert and oriented to person, place, and time. She has normal reflexes. No cranial nerve deficit. Coordination normal.  Skin: Skin is warm and dry. No rash noted. She is not diaphoretic.  Psychiatric: She has a normal mood and affect. Her behavior is normal. Judgment and thought content normal.  Vitals reviewed.         Assessment & Plan:  Normal health maintenance exam  Lab work reviewed with her and is entirely within normal limits. She has some occult blood in her urine today but has Mirena device and likely has some spotting from that. No urinary symptoms.  Plan: Have annual mammogram and return in one year or as needed.

## 2016-01-24 NOTE — Patient Instructions (Signed)
It was a pleasure to see you today. Have annual mammogram. Return in one year.

## 2016-01-25 ENCOUNTER — Encounter: Payer: Self-pay | Admitting: Internal Medicine

## 2016-01-26 ENCOUNTER — Other Ambulatory Visit: Payer: Self-pay | Admitting: Internal Medicine

## 2016-01-26 DIAGNOSIS — Z1231 Encounter for screening mammogram for malignant neoplasm of breast: Secondary | ICD-10-CM

## 2016-03-13 ENCOUNTER — Ambulatory Visit: Payer: 59

## 2016-03-13 ENCOUNTER — Ambulatory Visit
Admission: RE | Admit: 2016-03-13 | Discharge: 2016-03-13 | Disposition: A | Discharge status: Home / Self Care | Payer: 59 | Source: Ambulatory Visit | Attending: Internal Medicine | Admitting: Internal Medicine

## 2016-03-13 DIAGNOSIS — Z1231 Encounter for screening mammogram for malignant neoplasm of breast: Secondary | ICD-10-CM

## 2016-09-04 DIAGNOSIS — Z124 Encounter for screening for malignant neoplasm of cervix: Secondary | ICD-10-CM | POA: Diagnosis not present

## 2016-09-04 DIAGNOSIS — Z01419 Encounter for gynecological examination (general) (routine) without abnormal findings: Secondary | ICD-10-CM | POA: Diagnosis not present

## 2016-09-04 DIAGNOSIS — Z6823 Body mass index (BMI) 23.0-23.9, adult: Secondary | ICD-10-CM | POA: Diagnosis not present

## 2017-01-22 ENCOUNTER — Other Ambulatory Visit: Payer: 59 | Admitting: Internal Medicine

## 2017-01-22 DIAGNOSIS — Z1329 Encounter for screening for other suspected endocrine disorder: Secondary | ICD-10-CM

## 2017-01-22 DIAGNOSIS — Z130 Encounter for screening for diseases of the blood and blood-forming organs and certain disorders involving the immune mechanism: Secondary | ICD-10-CM

## 2017-01-22 DIAGNOSIS — Z1321 Encounter for screening for nutritional disorder: Secondary | ICD-10-CM

## 2017-01-22 DIAGNOSIS — Z13220 Encounter for screening for lipoid disorders: Secondary | ICD-10-CM

## 2017-01-22 DIAGNOSIS — Z0000 Encounter for general adult medical examination without abnormal findings: Secondary | ICD-10-CM

## 2017-01-22 LAB — CBC WITH DIFFERENTIAL/PLATELET
BASOS ABS: 54 {cells}/uL (ref 0–200)
Basophils Relative: 1 %
Eosinophils Absolute: 270 cells/uL (ref 15–500)
Eosinophils Relative: 5 %
HCT: 42.8 % (ref 35.0–45.0)
Hemoglobin: 14 g/dL (ref 11.7–15.5)
Lymphocytes Relative: 30 %
Lymphs Abs: 1620 cells/uL (ref 850–3900)
MCH: 31.4 pg (ref 27.0–33.0)
MCHC: 32.7 g/dL (ref 32.0–36.0)
MCV: 96 fL (ref 80.0–100.0)
MONOS PCT: 8 %
MPV: 9.7 fL (ref 7.5–12.5)
Monocytes Absolute: 432 cells/uL (ref 200–950)
NEUTROS ABS: 3024 {cells}/uL (ref 1500–7800)
NEUTROS PCT: 56 %
PLATELETS: 323 10*3/uL (ref 140–400)
RBC: 4.46 MIL/uL (ref 3.80–5.10)
RDW: 13 % (ref 11.0–15.0)
WBC: 5.4 10*3/uL (ref 3.8–10.8)

## 2017-01-22 LAB — TSH: TSH: 1.33 mIU/L

## 2017-01-23 LAB — LIPID PANEL
Cholesterol: 196 mg/dL (ref <200)
HDL: 69 mg/dL (ref >50)
LDL CALC: 116 mg/dL — AB (ref <100)
TRIGLYCERIDES: 54 mg/dL (ref <150)
Total CHOL/HDL Ratio: 2.8 Ratio (ref <5.0)
VLDL: 11 mg/dL (ref <30)

## 2017-01-23 LAB — COMPLETE METABOLIC PANEL WITH GFR
ALT: 11 U/L (ref 6–29)
AST: 15 U/L (ref 10–35)
Albumin: 4.6 g/dL (ref 3.6–5.1)
Alkaline Phosphatase: 87 U/L (ref 33–115)
BUN: 14 mg/dL (ref 7–25)
CHLORIDE: 101 mmol/L (ref 98–110)
CO2: 26 mmol/L (ref 20–31)
Calcium: 9.3 mg/dL (ref 8.6–10.2)
Creat: 0.75 mg/dL (ref 0.50–1.10)
GFR, Est African American: >89 mL/min (ref >=60)
Glucose, Bld: 81 mg/dL (ref 65–99)
POTASSIUM: 4.4 mmol/L (ref 3.5–5.3)
SODIUM: 140 mmol/L (ref 135–146)
Total Bilirubin: 0.9 mg/dL (ref 0.2–1.2)
Total Protein: 6.8 g/dL (ref 6.1–8.1)

## 2017-01-23 LAB — VITAMIN D 25 HYDROXY (VIT D DEFICIENCY, FRACTURES): Vit D, 25-Hydroxy: 41 ng/mL (ref 30–100)

## 2017-01-25 ENCOUNTER — Encounter: Payer: Self-pay | Admitting: Internal Medicine

## 2017-01-25 ENCOUNTER — Other Ambulatory Visit: Payer: Self-pay | Admitting: Internal Medicine

## 2017-01-25 ENCOUNTER — Ambulatory Visit (INDEPENDENT_AMBULATORY_CARE_PROVIDER_SITE_OTHER): Payer: 59 | Admitting: Internal Medicine

## 2017-01-25 VITALS — BP 98/50 | HR 64 | Temp 98.5°F | Ht 64.5 in | Wt 131.0 lb

## 2017-01-25 DIAGNOSIS — Z1231 Encounter for screening mammogram for malignant neoplasm of breast: Secondary | ICD-10-CM

## 2017-01-25 DIAGNOSIS — Z0000 Encounter for general adult medical examination without abnormal findings: Secondary | ICD-10-CM | POA: Diagnosis not present

## 2017-01-25 LAB — POCT URINALYSIS DIPSTICK
Bilirubin, UA: NEGATIVE
Glucose, UA: NEGATIVE
Ketones, UA: NEGATIVE
Leukocytes, UA: NEGATIVE
Nitrite, UA: NEGATIVE
PH UA: 6 (ref 5.0–8.0)
PROTEIN UA: NEGATIVE
RBC UA: NEGATIVE
Urobilinogen, UA: 0.2 E.U./dL

## 2017-01-25 NOTE — Progress Notes (Signed)
   Subjective:    Patient ID: Janet Mason, female    DOB: [birth year], 46 y.o.   MRN: [id number]  HPI  46 year old Female In for annual  health maintenance exam. Her general health is excellent. She sees a GYN physician for GYN care. No new problems or concerns. Lab work reviewed with her is entirely within normal limits with the exception of very mild elevated LDL cholesterol of 116. LDL cholesterol last year was 105. She'll watch her diet a bit.  No known drug allergies  Past medical history: Had Lasix surgery in 2000. History of fractured left hand and wrist after a fall in 2007. Fractured right arm at age 80. Right lower lobe pneumonia August 2013. Small umbilical hernia seen by Dr. Harlow Asa in 2013 which he felt should just be observed. History of incisional hernia repair 2005 status post C-section in 2005.  Gets annual flu vaccine through employment.  Tetanus immunization done January 2010.  In 2011, she saw Dr. Claudie Fisherman [facility] for evaluation of headaches. She was thought to have migraine with aura. This improved with discontinuation of oral contraceptives.  Social history: She is married. She is a nonsmoker. Social alcohol consumption. 2 daughters from a previous marriage who are healthy. Has a stepson. 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  Family history: Father died at age 46 of a sudden cardiac arrest. Mother with history of diabetes. 2 brothers, one of them has increased lipids and hypertension.    Review of Systems  Constitutional: Negative.   All other systems reviewed and are negative.      Objective:   Physical Exam  Constitutional: She is oriented to person, place, and time. She appears well-developed and well-nourished. No distress.  HENT:  Head: Normocephalic and atraumatic.  Right Ear: External ear normal.  Left Ear: External ear normal.  Mouth/Throat: Oropharynx is clear and moist.  Eyes: Pupils are equal, round,  and reactive to light. Conjunctivae and EOM are normal. Right eye exhibits no discharge. Left eye exhibits no discharge.  Neck: Neck supple. No JVD present. No thyromegaly present.  Cardiovascular: Normal rate, regular rhythm, normal heart sounds and intact distal pulses.   No murmur heard. Pulmonary/Chest: Effort normal and breath sounds normal. No respiratory distress. She has no wheezes.  Breast without masses  Abdominal: Soft. Bowel sounds are normal. She exhibits no distension and no mass. There is no tenderness. There is no rebound and no guarding.  Small umbilical hernia  Genitourinary:  Genitourinary Comments: Deferred to GYN-has IUD  Musculoskeletal: She exhibits no edema.  Lymphadenopathy:    She has no cervical adenopathy.  Neurological: She is alert and oriented to person, place, and time. She has normal reflexes. No cranial nerve deficit. Coordination normal.  Skin: Skin is warm and dry. No rash noted. She is not diaphoretic.  Psychiatric: She has a normal mood and affect. Her behavior is normal. Judgment and thought content normal.  Vitals reviewed.         Assessment & Plan:  Normal health maintenance exam  Small umbilical hernia  Plan: Lab work reviewed and is within normal limits except for very mild elevation of LDL cholesterol of 116 Return in one year or as needed. She will get flu vaccine through employment. Annual mammogram recommended.

## 2017-01-25 NOTE — Patient Instructions (Addendum)
It was a pleasure to see you  today. Obtain flu vaccine through work and continue to see GYN annually. Have annual mammogram.

## 2017-03-19 ENCOUNTER — Ambulatory Visit
Admission: RE | Admit: 2017-03-19 | Discharge: 2017-03-19 | Disposition: A | Discharge status: Home / Self Care | Payer: 59 | Source: Ambulatory Visit | Attending: Internal Medicine | Admitting: Internal Medicine

## 2017-03-19 DIAGNOSIS — Z1231 Encounter for screening mammogram for malignant neoplasm of breast: Secondary | ICD-10-CM | POA: Diagnosis not present

## 2017-03-20 ENCOUNTER — Other Ambulatory Visit: Payer: Self-pay | Admitting: Internal Medicine

## 2017-03-20 DIAGNOSIS — R928 Other abnormal and inconclusive findings on diagnostic imaging of breast: Secondary | ICD-10-CM

## 2017-03-25 ENCOUNTER — Ambulatory Visit
Admission: RE | Admit: 2017-03-25 | Discharge: 2017-03-25 | Disposition: A | Discharge status: Home / Self Care | Payer: 59 | Source: Ambulatory Visit | Attending: Internal Medicine | Admitting: Internal Medicine

## 2017-03-25 ENCOUNTER — Ambulatory Visit: Payer: 59

## 2017-03-25 DIAGNOSIS — R928 Other abnormal and inconclusive findings on diagnostic imaging of breast: Secondary | ICD-10-CM

## 2017-03-25 DIAGNOSIS — R922 Inconclusive mammogram: Secondary | ICD-10-CM | POA: Diagnosis not present

## 2017-04-05 ENCOUNTER — Ambulatory Visit: Payer: Self-pay | Admitting: Family Medicine

## 2017-04-05 NOTE — Progress Notes (Deleted)
  [name] [address] [place], [place] [address] Phone: [phone] Subjective:    I'm seeing this patient by the request  of:  Baxley, Cresenciano Lick, MD   CC: Right foot pain  [id number]  Janet Mason is a 46 y.o. female coming in with complaint of right foot pain .       Past Medical History:  Diagnosis Date  . Elevated CK   . 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 CESAREAN SECTION    . HERNIA REPAIR    . LASIK     Social History   Social History  . Marital status: Married    Spouse name: N/A  . Number of children: N/A  . Years of education: N/A   Social History Main Topics  . Smoking status: Never Smoker  . Smokeless tobacco: Never Used  . Alcohol use Yes     Comment: wine few times a week  . Drug use: Unknown  . Sexual activity: Yes    Birth control/ protection: IUD   Other Topics Concern  . Not on file   Social History Narrative  . No narrative on file   No Known Allergies Family History  Problem Relation Age of Onset  . Diabetes Mother   . Breast cancer Mother 38  . Heart disease Father      Past medical history, social, surgical and family history all reviewed in electronic medical record.  No pertanent information unless stated regarding to the chief complaint.   Review of Systems:Review of systems updated and as accurate as of 04/05/17  No headache, visual changes, nausea, vomiting, diarrhea, constipation, dizziness, abdominal pain, skin rash, fevers, chills, night sweats, weight loss, swollen lymph nodes, body aches, joint swelling, muscle aches, chest pain, shortness of breath, mood changes.   Objective  There were no vitals taken for this visit. Systems examined below as of 04/05/17   General: No apparent distress alert and oriented x3 mood and affect normal, dressed appropriately.  HEENT: Pupils equal, extraocular movements intact  Respiratory:  Patient's speak in full sentences and does not appear short of breath  Cardiovascular: No lower extremity edema, non tender, no erythema  Skin: Warm dry intact with no signs of infection or rash on extremities or on axial skeleton.  Abdomen: Soft nontender  Neuro: Cranial nerves II through XII are intact, neurovascularly intact in all extremities with 2+ DTRs and 2+ pulses.  Lymph: No lymphadenopathy of posterior or anterior cervical chain or axillae bilaterally.  Gait normal with good balance and coordination.  MSK:  Non tender with full range of motion and good stability and symmetric strength and tone of shoulders, elbows, wrist, hip, knee and ankles bilaterally.     Impression and Recommendations:     This case required medical decision making of moderate complexity.      Note: This dictation was prepared with Dragon dictation along with smaller phrase technology. Any transcriptional errors that result from this process are unintentional.

## 2017-09-09 DIAGNOSIS — Z124 Encounter for screening for malignant neoplasm of cervix: Secondary | ICD-10-CM | POA: Diagnosis not present

## 2017-09-09 DIAGNOSIS — Z01419 Encounter for gynecological examination (general) (routine) without abnormal findings: Secondary | ICD-10-CM | POA: Diagnosis not present

## 2017-09-09 DIAGNOSIS — Z6823 Body mass index (BMI) 23.0-23.9, adult: Secondary | ICD-10-CM | POA: Diagnosis not present

## 2017-11-05 DIAGNOSIS — H524 Presbyopia: Secondary | ICD-10-CM | POA: Diagnosis not present

## 2017-11-10 DIAGNOSIS — S0502XA Injury of conjunctiva and corneal abrasion without foreign body, left eye, initial encounter: Secondary | ICD-10-CM | POA: Diagnosis not present

## 2017-11-10 DIAGNOSIS — H209 Unspecified iridocyclitis: Secondary | ICD-10-CM | POA: Diagnosis not present

## 2017-11-12 ENCOUNTER — Encounter: Payer: Self-pay | Admitting: Internal Medicine

## 2017-11-12 DIAGNOSIS — S0502XA Injury of conjunctiva and corneal abrasion without foreign body, left eye, initial encounter: Secondary | ICD-10-CM | POA: Diagnosis not present

## 2017-12-03 ENCOUNTER — Encounter: Payer: Self-pay | Admitting: Internal Medicine

## 2017-12-03 DIAGNOSIS — H2000 Unspecified acute and subacute iridocyclitis: Secondary | ICD-10-CM | POA: Diagnosis not present

## 2017-12-03 LAB — HM DIABETES EYE EXAM

## 2018-01-22 ENCOUNTER — Other Ambulatory Visit: Payer: Self-pay | Admitting: Internal Medicine

## 2018-01-22 DIAGNOSIS — Z1329 Encounter for screening for other suspected endocrine disorder: Secondary | ICD-10-CM

## 2018-01-22 DIAGNOSIS — E7800 Pure hypercholesterolemia, unspecified: Secondary | ICD-10-CM

## 2018-01-22 DIAGNOSIS — Z1321 Encounter for screening for nutritional disorder: Secondary | ICD-10-CM

## 2018-01-22 DIAGNOSIS — Z0000 Encounter for general adult medical examination without abnormal findings: Secondary | ICD-10-CM

## 2018-01-24 ENCOUNTER — Other Ambulatory Visit: Payer: 59 | Admitting: Internal Medicine

## 2018-01-24 DIAGNOSIS — Z0000 Encounter for general adult medical examination without abnormal findings: Secondary | ICD-10-CM

## 2018-01-24 DIAGNOSIS — Z1329 Encounter for screening for other suspected endocrine disorder: Secondary | ICD-10-CM

## 2018-01-24 DIAGNOSIS — E7800 Pure hypercholesterolemia, unspecified: Secondary | ICD-10-CM | POA: Diagnosis not present

## 2018-01-24 DIAGNOSIS — Z1321 Encounter for screening for nutritional disorder: Secondary | ICD-10-CM | POA: Diagnosis not present

## 2018-01-25 LAB — LIPID PANEL
CHOL/HDL RATIO: 3 (calc) (ref <5.0)
Cholesterol: 216 mg/dL — ABNORMAL HIGH (ref <200)
HDL: 73 mg/dL (ref >50)
LDL CHOLESTEROL (CALC): 128 mg/dL — AB
NON-HDL CHOLESTEROL (CALC): 143 mg/dL — AB (ref <130)
TRIGLYCERIDES: 49 mg/dL (ref <150)

## 2018-01-25 LAB — CBC WITH DIFFERENTIAL/PLATELET
Basophils Absolute: 28 cells/uL (ref 0–200)
Basophils Relative: 0.6 %
EOS PCT: 6.3 %
Eosinophils Absolute: 290 cells/uL (ref 15–500)
HCT: 41.4 % (ref 35.0–45.0)
Hemoglobin: 14.1 g/dL (ref 11.7–15.5)
LYMPHS ABS: 1477 {cells}/uL (ref 850–3900)
MCH: 31.4 pg (ref 27.0–33.0)
MCHC: 34.1 g/dL (ref 32.0–36.0)
MCV: 92.2 fL (ref 80.0–100.0)
MPV: 10 fL (ref 7.5–12.5)
Monocytes Relative: 7.1 %
NEUTROS PCT: 53.9 %
Neutro Abs: 2479 cells/uL (ref 1500–7800)
PLATELETS: 325 10*3/uL (ref 140–400)
RBC: 4.49 10*6/uL (ref 3.80–5.10)
RDW: 11.8 % (ref 11.0–15.0)
Total Lymphocyte: 32.1 %
WBC mixed population: 327 cells/uL (ref 200–950)
WBC: 4.6 10*3/uL (ref 3.8–10.8)

## 2018-01-25 LAB — COMPLETE METABOLIC PANEL WITH GFR
AG Ratio: 2.2 (calc) (ref 1.0–2.5)
ALBUMIN MSPROF: 4.8 g/dL (ref 3.6–5.1)
ALKALINE PHOSPHATASE (APISO): 73 U/L (ref 33–115)
ALT: 12 U/L (ref 6–29)
AST: 14 U/L (ref 10–35)
BILIRUBIN TOTAL: 0.9 mg/dL (ref 0.2–1.2)
BUN: 12 mg/dL (ref 7–25)
CHLORIDE: 102 mmol/L (ref 98–110)
CO2: 29 mmol/L (ref 20–32)
Calcium: 9.6 mg/dL (ref 8.6–10.2)
Creat: 0.84 mg/dL (ref 0.50–1.10)
GFR, Est African American: 96 mL/min/{1.73_m2} (ref >=60)
GFR, Est Non African American: 83 mL/min/{1.73_m2} (ref >=60)
GLUCOSE: 84 mg/dL (ref 65–99)
Globulin: 2.2 g/dL (calc) (ref 1.9–3.7)
Potassium: 4.3 mmol/L (ref 3.5–5.3)
Sodium: 140 mmol/L (ref 135–146)
Total Protein: 7 g/dL (ref 6.1–8.1)

## 2018-01-25 LAB — TSH: TSH: 1.31 mIU/L

## 2018-01-25 LAB — VITAMIN D 25 HYDROXY (VIT D DEFICIENCY, FRACTURES): Vit D, 25-Hydroxy: 29 ng/mL — ABNORMAL LOW (ref 30–100)

## 2018-01-28 ENCOUNTER — Ambulatory Visit (INDEPENDENT_AMBULATORY_CARE_PROVIDER_SITE_OTHER): Payer: 59 | Admitting: Internal Medicine

## 2018-01-28 ENCOUNTER — Encounter: Payer: Self-pay | Admitting: Internal Medicine

## 2018-01-28 VITALS — BP 100/58 | HR 73 | Ht 65.0 in | Wt 132.0 lb

## 2018-01-28 DIAGNOSIS — H209 Unspecified iridocyclitis: Secondary | ICD-10-CM

## 2018-01-28 DIAGNOSIS — Z0000 Encounter for general adult medical examination without abnormal findings: Secondary | ICD-10-CM

## 2018-01-28 DIAGNOSIS — E7800 Pure hypercholesterolemia, unspecified: Secondary | ICD-10-CM

## 2018-01-28 LAB — POCT URINALYSIS DIPSTICK
APPEARANCE: NORMAL
BILIRUBIN UA: NEGATIVE
GLUCOSE UA: NEGATIVE
KETONES UA: NEGATIVE
Leukocytes, UA: NEGATIVE
Nitrite, UA: NEGATIVE
ODOR: NORMAL
Protein, UA: NEGATIVE
RBC UA: NEGATIVE
Spec Grav, UA: 1.015 (ref 1.010–1.025)
Urobilinogen, UA: 0.2 E.U./dL
pH, UA: 6 (ref 5.0–8.0)

## 2018-01-28 NOTE — Progress Notes (Signed)
Subjective:    Patient ID: Janet Mason, female    DOB: 11/18/1970, 47 y.o.   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  She takes vitamin D supplement and is using Mirena for birth control.  Has GYN physician.  Was seen in [facility] by Dr. Claudean Kinds with traumatic iritis.  This happened at her home in her attic when she was going through some boxes and an elastic  band suddenly struck her in the eye.  She does not feel that her vision has completely returned to normal and she will call them for another appointment.  Her general health is excellent.  No known drug allergies.  Tetanus immunization January 2010.  Gets flu vaccine through employment.  Past medical history: Lasik surgery in 2000.  History of fracture left hand and wrist after fall in 2007.  Fractured right arm at age 26.  Right lower lobe pneumonia August 2013.  Small umbilical hernia seen by Dr. Harlow Asa in 2013 which he felt should just be observed.  History of incisional hernia repair 2005 status post C-section in 2005.  2011 [facility] and was have migraine with aura.  This improved with discontinuation of oral contraceptives.  Social history: She is married.  She is a non-smoker.  Social alcohol consumption.  2 daughters from a previous marriage who are healthy.  She has a stepson.  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  Family history: Father died at age 40 of a sudden cardiac arrest.  Mother with history of diabetes.  2 brothers, 1 of them has increased lipids and hypertension.    Review of Systems  Constitutional: Negative.   Eyes:       Does not feel vision is exactly back to normal status post traumatic iritis  Respiratory: Negative.   Cardiovascular: Negative.   Gastrointestinal: Negative.   Genitourinary:  Negative.   Neurological: Negative.        Objective:   Physical Exam  Constitutional: She is oriented to person, place, and time. She appears well-developed and well-nourished.  HENT:  Head: Normocephalic.  Right Ear: External ear normal.  Left Ear: External ear normal.  Mouth/Throat: Oropharynx is clear and moist. No oropharyngeal exudate.  Eyes: Pupils are equal, round, and reactive to light. Right eye exhibits no discharge. Left eye exhibits no discharge. No scleral icterus.  Neck: No JVD present. No thyromegaly present.  Cardiovascular: Normal rate, regular rhythm and normal heart sounds.  No murmur heard. Pulmonary/Chest: Effort normal and breath sounds normal. No stridor. No respiratory distress. She has no wheezes. She has no rales.  Breasts normal  Abdominal: Soft. She exhibits no distension and no mass. There is no tenderness. There is no rebound.  Small umbilical hernia reducible  Genitourinary:  Genitourinary Comments: Deferred to GYN  Musculoskeletal: She exhibits no edema.  Lymphadenopathy:    She has no cervical adenopathy.  Neurological: She is alert and oriented to person, place, and time.  No focal deficits  Skin: Skin is warm and dry.  Psychiatric: She has a normal mood and affect. Her behavior is normal. Judgment and thought content normal.  Vitals reviewed.         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  Pure hypercholesterolemia- LDLelevated at 128  and total cholesterol 216. Diet and exercise. Does not want to be on statin medication. Recheck in Feb 2020 without OV

## 2018-02-03 DIAGNOSIS — L814 Other melanin hyperpigmentation: Secondary | ICD-10-CM | POA: Diagnosis not present

## 2018-02-03 DIAGNOSIS — L818 Other specified disorders of pigmentation: Secondary | ICD-10-CM | POA: Diagnosis not present

## 2018-02-03 DIAGNOSIS — D485 Neoplasm of uncertain behavior of skin: Secondary | ICD-10-CM | POA: Diagnosis not present

## 2018-02-03 DIAGNOSIS — D2271 Melanocytic nevi of right lower limb, including hip: Secondary | ICD-10-CM | POA: Diagnosis not present

## 2018-02-04 DIAGNOSIS — H5712 Ocular pain, left eye: Secondary | ICD-10-CM | POA: Diagnosis not present

## 2018-02-17 NOTE — Patient Instructions (Signed)
It  was a pleasure to see you today.  See ophthalmologist regarding decreased vision and following traumatic iritis/injury.  Watch diet and exercise and follow-up with lipid panel only here without office visit February 2020

## 2018-03-25 ENCOUNTER — Ambulatory Visit
Admission: RE | Admit: 2018-03-25 | Discharge: 2018-03-25 | Disposition: A | Discharge status: Home / Self Care | Payer: 59 | Source: Ambulatory Visit | Attending: Internal Medicine | Admitting: Internal Medicine

## 2018-03-25 DIAGNOSIS — Z1231 Encounter for screening mammogram for malignant neoplasm of breast: Secondary | ICD-10-CM | POA: Diagnosis not present

## 2018-08-05 ENCOUNTER — Telehealth: Payer: Self-pay | Admitting: Internal Medicine

## 2018-08-05 NOTE — Telephone Encounter (Signed)
Dr. Zollie Beckers, Dr. Hector Shade, or Dr. Elsie Saas. They are in different practices.

## 2018-08-05 NOTE — Telephone Encounter (Signed)
Called patient and provided names.  She is appreciative and she'll call and make an appointment at the place of her choosing.

## 2018-08-05 NOTE — Telephone Encounter (Signed)
Patient states that she's having right knee pain.  Has had this previously and brought it to your attention.  It has been bothering her this time since about October.  She feels it is probably brought on by exercise.  States that she knows she abuses her knees.  She would like to know who you would recommend that she see for this issue?    Thank you.  Phone #:  [phone]

## 2018-08-08 ENCOUNTER — Other Ambulatory Visit: Payer: 59 | Admitting: Internal Medicine

## 2018-08-08 DIAGNOSIS — E7800 Pure hypercholesterolemia, unspecified: Secondary | ICD-10-CM

## 2018-08-09 LAB — LIPID PANEL
CHOLESTEROL: 213 mg/dL — AB (ref <200)
HDL: 70 mg/dL (ref >=50)
LDL Cholesterol (Calc): 128 mg/dL (calc) — ABNORMAL HIGH
NON-HDL CHOLESTEROL (CALC): 143 mg/dL — AB (ref <130)
Total CHOL/HDL Ratio: 3 (calc) (ref <5.0)
Triglycerides: 60 mg/dL (ref <150)

## 2018-08-26 ENCOUNTER — Ambulatory Visit (INDEPENDENT_AMBULATORY_CARE_PROVIDER_SITE_OTHER): Payer: 59

## 2018-08-26 ENCOUNTER — Ambulatory Visit (INDEPENDENT_AMBULATORY_CARE_PROVIDER_SITE_OTHER): Payer: 59 | Admitting: Orthopaedic Surgery

## 2018-08-26 DIAGNOSIS — M25561 Pain in right knee: Secondary | ICD-10-CM

## 2018-08-26 DIAGNOSIS — N83209 Unspecified ovarian cyst, unspecified side: Secondary | ICD-10-CM | POA: Insufficient documentation

## 2018-08-26 NOTE — Progress Notes (Signed)
Office Visit Note   Patient: Janet Mason           Date of Birth: 16-Nov-1970           MRN: [id number] Visit Date: 08/26/2018              Requested by: Everett Graff, MD [address] [place], [place] [address] PCP: Everett Graff, MD   Assessment & Plan: Visit Diagnoses:  1. Right knee pain, unspecified chronicity     Plan: Quad strengthening. Knee friendly exercises discussed with patient at length.  Reassurance is given by Dr. Earlyne Iba and myself that her radiographs showed no significant arthritis.  Her symptoms should resolve with quad strengthening particularly the vastus medialis oblique muscle.  She will follow-up on as-needed basis pain persist or becomes worse.  Follow-Up Instructions: Return if symptoms worsen or fail to improve.   Orders:  Orders Placed This Encounter  Procedures  . XR Knee 1-2 Views Right   No orders of the defined types were placed in this encounter.     Procedures: No procedures performed   Clinical Data: No additional findings.   Subjective: Chief Complaint  Patient presents with  . Right Knee - Pain    HPI [name] is a 48 year old female comes in today with right knee pain is been ongoing for the last few months.  She is had no known injury.  She does note that she was doing some interval training when the pain began and at that time was doing lunges.  She has decreased her activity and her pain is greatly dissipated.  She is had no mechanical symptoms in the knee.  She does have a problem with going downstairs and states this is been the case for a long period of time.  Notes some popping medial aspect the knee and some pain with flexion in the back of the knee.  She notes no swelling of the knee.  Pain worse with extension. Review of Systems Please see HPI otherwise negative or noncontributory.  Objective: Vital Signs: There were no vitals taken for this visit.  Physical Exam Constitutional:    Appearance: She is normal weight. She is not ill-appearing or diaphoretic.  Pulmonary:     Effort: Pulmonary effort is normal.  Neurological:     Mental Status: She is alert and oriented to person, place, and time.     Ortho Exam Bilateral knees full range of motion without pain.  Crepitus with passive range of motion both knees with slight lateralization of the right patella.  Osmond Monta Maiorana's negative bilaterally.  McMurray's negative bilaterally.  No instability valgus varus stressing.  No effusion abnormal warmth of either knee. Specialty Comments:  No specialty comments available.  Imaging: Xr Knee 1-2 Views Right  Result Date: 08/26/2018 Right knee AP and lateral views: The knee is overall well-preserved.  Slight lateralization of the patella on the AP view.  Mild patellar osteophyte spurring.  No acute fractures.  Knee is well located.    PMFS History: Patient Active Problem List   Diagnosis Date Noted  . Cyst of ovary 08/26/2018  . IUD contraception - inserted 04/18/10 06/10/2012  . Umbilical hernia 76/73/4193  . Abnormal cervical Papanicolaou smear 06/25/1989   Past Medical History:  Diagnosis Date  . Elevated CK   . 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 Diabetes Mother   . Breast cancer Mother 43  .  Heart disease Father     Past Surgical History:  Procedure Laterality Date  . CESAREAN SECTION    . HERNIA REPAIR    . LASIK     Social History   Occupational History  . Not on file  Tobacco Use  . Smoking status: Never Smoker  . Smokeless tobacco: Never Used  Substance and Sexual Activity  . Alcohol use: Yes    Comment: wine few times a week  . Drug use: Not on file  . Sexual activity: Yes    Birth control/protection: I.U.D.

## 2018-10-08 DIAGNOSIS — Z124 Encounter for screening for malignant neoplasm of cervix: Secondary | ICD-10-CM | POA: Diagnosis not present

## 2018-10-08 DIAGNOSIS — Z23 Encounter for immunization: Secondary | ICD-10-CM | POA: Diagnosis not present

## 2018-10-08 DIAGNOSIS — Z01419 Encounter for gynecological examination (general) (routine) without abnormal findings: Secondary | ICD-10-CM | POA: Diagnosis not present

## 2019-02-09 ENCOUNTER — Other Ambulatory Visit: Payer: Self-pay | Admitting: Internal Medicine

## 2019-02-09 DIAGNOSIS — Z1231 Encounter for screening mammogram for malignant neoplasm of breast: Secondary | ICD-10-CM

## 2019-02-10 ENCOUNTER — Other Ambulatory Visit: Payer: Self-pay

## 2019-02-10 ENCOUNTER — Other Ambulatory Visit: Payer: 59 | Admitting: Internal Medicine

## 2019-02-10 DIAGNOSIS — Z1329 Encounter for screening for other suspected endocrine disorder: Secondary | ICD-10-CM

## 2019-02-10 DIAGNOSIS — E7800 Pure hypercholesterolemia, unspecified: Secondary | ICD-10-CM

## 2019-02-10 DIAGNOSIS — Z0000 Encounter for general adult medical examination without abnormal findings: Secondary | ICD-10-CM

## 2019-02-10 DIAGNOSIS — E559 Vitamin D deficiency, unspecified: Secondary | ICD-10-CM

## 2019-02-11 LAB — CBC WITH DIFFERENTIAL/PLATELET
Absolute Monocytes: 333 cells/uL (ref 200–950)
Basophils Absolute: 39 cells/uL (ref 0–200)
Basophils Relative: 0.8 %
Eosinophils Absolute: 304 cells/uL (ref 15–500)
Eosinophils Relative: 6.2 %
HCT: 43.7 % (ref 35.0–45.0)
Hemoglobin: 14.6 g/dL (ref 11.7–15.5)
Lymphs Abs: 1495 cells/uL (ref 850–3900)
MCH: 31.1 pg (ref 27.0–33.0)
MCHC: 33.4 g/dL (ref 32.0–36.0)
MCV: 93 fL (ref 80.0–100.0)
MPV: 10.4 fL (ref 7.5–12.5)
Monocytes Relative: 6.8 %
Neutro Abs: 2729 cells/uL (ref 1500–7800)
Neutrophils Relative %: 55.7 %
Platelets: 308 10*3/uL (ref 140–400)
RBC: 4.7 10*6/uL (ref 3.80–5.10)
RDW: 11.8 % (ref 11.0–15.0)
Total Lymphocyte: 30.5 %
WBC: 4.9 10*3/uL (ref 3.8–10.8)

## 2019-02-11 LAB — COMPLETE METABOLIC PANEL WITH GFR
AG Ratio: 2.2 (calc) (ref 1.0–2.5)
ALT: 14 U/L (ref 6–29)
AST: 15 U/L (ref 10–35)
Albumin: 5 g/dL (ref 3.6–5.1)
Alkaline phosphatase (APISO): 81 U/L (ref 31–125)
BUN: 16 mg/dL (ref 7–25)
CO2: 28 mmol/L (ref 20–32)
Calcium: 9.9 mg/dL (ref 8.6–10.2)
Chloride: 102 mmol/L (ref 98–110)
Creat: 0.76 mg/dL (ref 0.50–1.10)
GFR, Est African American: 108 mL/min/{1.73_m2} (ref >=60)
GFR, Est Non African American: 93 mL/min/{1.73_m2} (ref >=60)
Globulin: 2.3 g/dL (calc) (ref 1.9–3.7)
Glucose, Bld: 84 mg/dL (ref 65–99)
Potassium: 4.3 mmol/L (ref 3.5–5.3)
Sodium: 139 mmol/L (ref 135–146)
Total Bilirubin: 0.7 mg/dL (ref 0.2–1.2)
Total Protein: 7.3 g/dL (ref 6.1–8.1)

## 2019-02-11 LAB — LIPID PANEL
Cholesterol: 220 mg/dL — ABNORMAL HIGH (ref <200)
HDL: 69 mg/dL (ref >=50)
LDL Cholesterol (Calc): 134 mg/dL (calc) — ABNORMAL HIGH
Non-HDL Cholesterol (Calc): 151 mg/dL (calc) — ABNORMAL HIGH (ref <130)
Total CHOL/HDL Ratio: 3.2 (calc) (ref <5.0)
Triglycerides: 77 mg/dL (ref <150)

## 2019-02-11 LAB — TSH: TSH: 1.42 mIU/L

## 2019-02-11 LAB — VITAMIN D 25 HYDROXY (VIT D DEFICIENCY, FRACTURES): Vit D, 25-Hydroxy: 33 ng/mL (ref 30–100)

## 2019-02-13 ENCOUNTER — Encounter: Payer: Self-pay | Admitting: Internal Medicine

## 2019-02-13 ENCOUNTER — Ambulatory Visit (INDEPENDENT_AMBULATORY_CARE_PROVIDER_SITE_OTHER): Payer: 59 | Admitting: Internal Medicine

## 2019-02-13 ENCOUNTER — Other Ambulatory Visit: Payer: Self-pay

## 2019-02-13 VITALS — BP 102/70 | HR 69 | Temp 98.3°F | Ht 65.0 in | Wt 138.0 lb

## 2019-02-13 DIAGNOSIS — Z0000 Encounter for general adult medical examination without abnormal findings: Secondary | ICD-10-CM | POA: Diagnosis not present

## 2019-02-13 DIAGNOSIS — Z23 Encounter for immunization: Secondary | ICD-10-CM

## 2019-02-13 LAB — POCT URINALYSIS DIPSTICK
Appearance: NEGATIVE
Bilirubin, UA: NEGATIVE
Blood, UA: NEGATIVE
Glucose, UA: NEGATIVE
Ketones, UA: NEGATIVE
Leukocytes, UA: NEGATIVE
Nitrite, UA: NEGATIVE
Odor: NEGATIVE
Protein, UA: NEGATIVE
Spec Grav, UA: 1.01 (ref 1.010–1.025)
Urobilinogen, UA: 0.2 E.U./dL
pH, UA: 6.5 (ref 5.0–8.0)

## 2019-02-13 NOTE — Progress Notes (Signed)
   Subjective:    Patient ID: Janet Mason, female    DOB: 02-10-1971, 48 y.o.   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  Her general health is excellent.  She has no known drug allergies.  Tetanus immunization update given today.  Gets flu vaccine through employment.  History of traumatic iritis in 2019 seen by Dr. Claudean Kinds.  Had Lasik surgery in 2000.  History of fractured left hand and wrist after a fall 2007.  Fractured right arm at age 66.  Right lower lobe pneumonia August 2013.  Small umbilical hernia seen by Dr. Harlow Asa in 2013 which he felt should just be observed.  History of incisional hernia repair in 2005 status post C-section in 2005.  In 2011 she [facility].  Was diagnosed with migraine with aura.  This improved with discontinuation of oral contraceptives.  Social history: She is married.  Non-smoker.  Social alcohol consumption.  2 daughters from a previous marriage who are healthy.  She has a stepson.  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  Family history: Father died at age 33 of a sudden cardiac arrest.  Mother with history of diabetes.  2 brothers 1 of whom has increased lipids and hypertension.   Review of fasting labs indicate she has total cholesterol of 220, LDL of 134, HDL of 69 and triglycerides of 77.  He has had similar results over the past 2 years.  Discussion regarding statin therapy but she really does not want to do that.  We have agreed she will return in February 2021 for repeat lipid panel.   Has Mirena device for birth control.  Takes vitamin D supplement.   Review of Systems  Constitutional: Negative.   All other systems reviewed and are negative.      Objective:   Physical Exam Blood pressure 102/70 pulse 69 temperature 98.3 pulse oximetry 98% weight 138 pounds BMI 22.96  Skin  warm and dry.  Nodes none.  Neck is supple without JVD thyromegaly or carotid bruits.  TMs and pharynx are clear.  Chest clear to auscultation.  Cardiac exam regular rate and rhythm normal S1 and S2.  Breasts normal female without masses.  Abdomen no hepatosplenomegaly masses or tenderness appreciated.  Pelvic exam deferred to GYN physician.  No deformity of upper or lower extremities and no edema of the lower extremities.  Neuro no focal deficits.  Affect is normal.       Assessment & Plan:  Pure hypercholesterolemia  Plan: Patient will return in 6 months for repeat fasting lipid panel.  Does not want to be on statin therapy at this time.  Tetanus immunization update given.

## 2019-02-22 ENCOUNTER — Encounter: Payer: Self-pay | Admitting: Internal Medicine

## 2019-02-22 NOTE — Patient Instructions (Signed)
It was a pleasure to see you today.  Return in 6 months for fasting lipid panel.  Tetanus immunization update given today.

## 2019-03-27 ENCOUNTER — Other Ambulatory Visit: Payer: Self-pay

## 2019-03-27 ENCOUNTER — Ambulatory Visit
Admission: RE | Admit: 2019-03-27 | Discharge: 2019-03-27 | Disposition: A | Discharge status: Home / Self Care | Payer: 59 | Source: Ambulatory Visit | Attending: Internal Medicine | Admitting: Internal Medicine

## 2019-03-27 DIAGNOSIS — Z1231 Encounter for screening mammogram for malignant neoplasm of breast: Secondary | ICD-10-CM

## 2019-08-18 ENCOUNTER — Other Ambulatory Visit: Payer: Self-pay

## 2019-08-18 ENCOUNTER — Other Ambulatory Visit: Payer: 59 | Admitting: Internal Medicine

## 2019-08-18 DIAGNOSIS — E7800 Pure hypercholesterolemia, unspecified: Secondary | ICD-10-CM

## 2019-08-18 LAB — LIPID PANEL
Cholesterol: 240 mg/dL — ABNORMAL HIGH (ref <200)
HDL: 69 mg/dL (ref >=50)
LDL Cholesterol (Calc): 156 mg/dL (calc) — ABNORMAL HIGH
Non-HDL Cholesterol (Calc): 171 mg/dL (calc) — ABNORMAL HIGH (ref <130)
Total CHOL/HDL Ratio: 3.5 (calc) (ref <5.0)
Triglycerides: 58 mg/dL (ref <150)

## 2019-08-28 ENCOUNTER — Encounter: Payer: Self-pay | Admitting: Internal Medicine

## 2019-08-28 ENCOUNTER — Ambulatory Visit: Payer: 59 | Admitting: Internal Medicine

## 2019-08-28 ENCOUNTER — Other Ambulatory Visit: Payer: Self-pay

## 2019-08-28 VITALS — BP 102/70 | HR 74 | Temp 98.0°F | Ht 65.0 in | Wt 134.0 lb

## 2019-08-28 DIAGNOSIS — E7800 Pure hypercholesterolemia, unspecified: Secondary | ICD-10-CM

## 2019-08-28 MED ORDER — ROSUVASTATIN CALCIUM 5 MG PO TABS: ORAL_TABLET | ORAL | 0 refills | Status: DC

## 2019-08-28 MED ORDER — ROSUVASTATIN CALCIUM 5 MG PO TABS: 5.0000 mg | ORAL_TABLET | Freq: Every day | ORAL | 0 refills | Status: DC

## 2019-09-22 NOTE — Progress Notes (Signed)
   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   MRN: [id number]  HPI 49 year old Female seen in August for health maintenance exam and had elevated total cholesterol of 220 with an LDL cholesterol of 134. In February 2020 total cholesterol was 213 with an LDL of 128. Triglycerides have been normal. In 2019 total cholesterol was 216 with an LDL cholesterol of 128. She exercises regularly and watches her diet. 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  She is now here for follow-up. Total cholesterol is 240, HDL cholesterol 69, triglycerides 58, LDL cholesterol 156.  Reports she is trying to watch diet and exercise.  Review of Systems see above-no new complaints. Has had COVID-19 vaccine     Objective:   Physical Exam  Blood pressure 102/70 pulse 74 temperature 98 degrees orally pulse oximetry 98% weight 134 pounds BMI 22.30  Total cholesterol has increased from 220 in August to 240. HDL and triglycerides are stable and are within normal limits. LDL has increased from 134 to 156.    Assessment & Plan:  Pure hypercholesterolemia  Plan: Patient will take Crestor 5 mg weekly and will follow up with fasting lipid panel and liver functions in May.

## 2019-09-22 NOTE — Patient Instructions (Signed)
Start Crestor 5 mg weekly and follow-up in May.

## 2019-10-06 ENCOUNTER — Ambulatory Visit: Payer: 59 | Admitting: Orthopaedic Surgery

## 2019-10-09 ENCOUNTER — Ambulatory Visit: Payer: Self-pay

## 2019-10-09 ENCOUNTER — Ambulatory Visit: Payer: 59 | Admitting: Family Medicine

## 2019-10-09 ENCOUNTER — Other Ambulatory Visit: Payer: Self-pay

## 2019-10-09 ENCOUNTER — Encounter: Payer: Self-pay | Admitting: Family Medicine

## 2019-10-09 DIAGNOSIS — M25511 Pain in right shoulder: Secondary | ICD-10-CM | POA: Diagnosis not present

## 2019-10-09 DIAGNOSIS — E559 Vitamin D deficiency, unspecified: Secondary | ICD-10-CM | POA: Insufficient documentation

## 2019-10-09 DIAGNOSIS — M25512 Pain in left shoulder: Secondary | ICD-10-CM | POA: Diagnosis not present

## 2019-10-09 DIAGNOSIS — M542 Cervicalgia: Secondary | ICD-10-CM | POA: Diagnosis not present

## 2019-10-09 NOTE — Patient Instructions (Signed)
    Consider a CT Calcium Score test (cardiac CT).  - Vitamin D3:  5,000 IU daily  - CoQ10:  100 mg daily  - Magnesium:  400 mg daily

## 2019-10-09 NOTE — Progress Notes (Signed)
Office Visit Note   Patient: Hubert Azure, MD           Date of Birth: 09-02-1970           MRN: [id number] Visit Date: 10/09/2019 Requested by: Elby Showers, MD [address] [place],  [place] [id number] PCP: Elby Showers, MD  Subjective: Chief Complaint  Patient presents with  . Left Shoulder - Pain    Was having pain with certain arm motions 3-4 weeks ago. Got some better since. Crunching and popping in both shoulder. Still has pain in the top of the shoulder with raising the arm. NKI.  Marland Kitchen Neck - Pain    Pain x 2 years. Did have shooting pain down the left side of the neck into the shoulder 3-4 weeks ago. Got better while on vacation, but is returning again.    HPI: She is here with neck and bilateral shoulder pain.  She is right-hand dominant.  She has had intermittent pain in the neck for about 2 years with no definite injury.  Her shoulders have bothered her intermittently during that time as well.  Neck pain is midline and toward the left, with stiffness when rotating her head to the left.  Denies any radicular symptoms.  She also has intermittent popping in the shoulders, and when she lies on her side at night, they feel like they shift out of position sometimes.  She recalls a head injury at age 22 skiing but did not have any neck pain afterward.  Other than that she has not had any major accidents.  She recently took a vacation and felt much better on vacation.  She thinks her occupation might be contributing to her pain due to the posture when sitting and looking downward.  She does note that she has had intermittent numbness and tingling in her hands at night, usually both hands at once.  She thinks it is from putting pressure on her elbows.  From a medical standpoint she has vitamin D deficiency which is currently treated over-the-counter.  She also has hyperlipidemia and is on Crestor once weekly for the past month.  She thinks it might be making her pain  worse.              ROS:   All other systems were reviewed and are negative.  Objective: Vital Signs: There were no vitals taken for this visit.  Physical Exam:  General:  Alert and oriented, in no acute distress. Pulm:  Breathing unlabored. Psy:  Normal mood, congruent affect. Skin: No visible rash. Neck: She has slight stiffness when rotating to the left.  Spurling's test is negative.  She has a tender trigger point to the left of midline at about C5-6.  Upper extremity strength and reflexes are normal. Shoulders: Full active range of motion bilaterally.  No detectable instability.  She does have some crepitus with active and passive range of motion in both shoulders.  Isometric rotator cuff strength is 5/5 throughout.  Phalen's test negative bilaterally, Tinel's at the carpal tunnel is negative.  Ulnar compression at the elbow negative.   Imaging: X-ray cervical spine: She has moderate to severe C5-6 and moderate C6-7 degenerative disc disease.  She has uncovertebral DJD at those levels.  No sign of compression deformity or neoplasm.  X-rays shoulders: She has a slightly hooked acromion bilaterally with no glenohumeral DJD.    Assessment & Plan: 1.  C5-6 and C6-7 degenerative disc disease -We will try physical  therapy.  If symptoms persist, then MRI cervical spine. -She will make sure to take adequate vitamin D3, magnesium and co-Q10.  2.  Bilateral shoulder impingement -Physical therapy.  If pain worsens, could contemplate subacromial injection.     Procedures: No procedures performed  No notes on file     PMFS History: Patient Active Problem List   Diagnosis Date Noted  . Vitamin D deficiency disease 10/09/2019  . Cyst of ovary 08/26/2018  . IUD contraception - inserted 04/18/10 06/10/2012  . Umbilical hernia [id number]  . Abnormal cervical Papanicolaou smear 06/25/1989   Past Medical History:  Diagnosis Date  . Elevated CK   . 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 Diabetes Mother   . Breast cancer Mother 74  . Heart disease Father     Past Surgical History:  Procedure Laterality Date  . CESAREAN SECTION    . HERNIA REPAIR    . LASIK     Social History   Occupational History  . Not on file  Tobacco Use  . Smoking status: Never Smoker  . Smokeless tobacco: Never Used  Substance and Sexual Activity  . Alcohol use: Yes    Comment: wine few times a week  . Drug use: Not on file  . Sexual activity: Yes    Birth control/protection: I.U.D.

## 2019-10-13 ENCOUNTER — Other Ambulatory Visit: Payer: Self-pay

## 2019-10-13 ENCOUNTER — Ambulatory Visit (INDEPENDENT_AMBULATORY_CARE_PROVIDER_SITE_OTHER): Payer: 59 | Admitting: Internal Medicine

## 2019-10-13 ENCOUNTER — Encounter: Payer: Self-pay | Admitting: Internal Medicine

## 2019-10-13 VITALS — BP 110/62 | HR 62 | Temp 98.0°F | Ht 65.0 in | Wt 132.0 lb

## 2019-10-13 DIAGNOSIS — M7918 Myalgia, other site: Secondary | ICD-10-CM

## 2019-10-13 DIAGNOSIS — E7800 Pure hypercholesterolemia, unspecified: Secondary | ICD-10-CM

## 2019-10-13 DIAGNOSIS — Z8249 Family history of ischemic heart disease and other diseases of the circulatory system: Secondary | ICD-10-CM

## 2019-10-13 NOTE — Progress Notes (Addendum)
   Subjective:    Patient ID: Janet Azure, MD, female    DOB: 1971-05-30, 49 y.o.   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  Since 2017 has had elevated LDL.  Initially in 2017 it was 116, mid June 2019 it was 128 with total cholesterol 216, and 2028 LDL was 128 with total cholesterol 213.  In August 2020 LDL was 134 with total cholesterol of 220.  In February 2021 total cholesterol was 240 with an LDL of 156.    There is a family history of heart disease in her father died at age 60 of sudden cardiac arrest.  She has 2 brothers 1 of which has increased lipids and hypertension.  She was having some neck and shoulder pain and she saw Dr. Junius Roads.  Thought to have some cervical disc disease.  She went on vacation for a week and neck and shoulder pain were better.  She took 1 dose of Crestor, returned to work and her neck pain was worse.  She is now in physical therapy.  Dr. Junius Roads recommended magnesium CoQ10 and increase her vitamin D supplement.  She is now reluctant to start Crestor.  However it seems that she has significant pure hypercholesterolemia.  She is also interested in getting a CT calcium score with family history.  She exercises and stays in good shape.  Her BMI is 21.97    Review of Systems see above     Objective:   Physical Exam Not examined today but spent 10-15 minutes speaking with her about these issues and concerns.  I think she would be best served by a Cardiology evaluation with Dr. Debara Pickett who could advise her about her cardiac risks and assist in lipid management.  She is agreeable to this plan.       Assessment & Plan:  Pure hypercholesterolemia-referred to Dr. Debara Pickett for evaluation and options for treatment  Neck and shoulder pain being evaluated by Dr. Talmage Nap am not sure that this is related to statin medication  Plan: Referral to Dr. Debara Pickett for cardiology evaluation with  family history and father of sudden cardiac event and management of lipids.

## 2019-10-13 NOTE — Patient Instructions (Signed)
We will arrange for cardiology consultation with Dr. Debara Pickett regarding lipid management and cardiac risk.  It was a pleasure to see you today.

## 2019-10-27 ENCOUNTER — Other Ambulatory Visit: Payer: 59 | Admitting: Internal Medicine

## 2019-12-31 ENCOUNTER — Other Ambulatory Visit: Payer: Self-pay

## 2019-12-31 ENCOUNTER — Encounter: Payer: Self-pay | Admitting: Internal Medicine

## 2019-12-31 ENCOUNTER — Ambulatory Visit: Payer: 59 | Admitting: Internal Medicine

## 2019-12-31 VITALS — BP 106/74 | HR 76 | Ht 64.0 in | Wt 135.0 lb

## 2019-12-31 DIAGNOSIS — E785 Hyperlipidemia, unspecified: Secondary | ICD-10-CM

## 2019-12-31 DIAGNOSIS — Z8249 Family history of ischemic heart disease and other diseases of the circulatory system: Secondary | ICD-10-CM | POA: Diagnosis not present

## 2019-12-31 MED ORDER — ROSUVASTATIN CALCIUM 5 MG PO TABS: 5.0000 mg | ORAL_TABLET | ORAL | 3 refills | Status: DC

## 2019-12-31 NOTE — Progress Notes (Signed)
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  This included her father who had early onset heart disease.  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  She was referred by Dr. Renold Genta for evaluation and management of dyslipidemia.  She was noted to have elevated cholesterol and started on rosuvastatin 5 mg q. weekly back in February.  She then started noticing problems with neck and upper back pain.  She was referred to see Dr. Junius Roads for orthospine evaluation.  Ultimately after undergoing some physical therapy, massage and other modifications she has had some improvement but she also discontinued her statin.  She is concerned that this may have been related as a side effect.  I assured her that I thought that that was unlikely due to the significant in frequency and low-dose of the medication.  That being said her most recent lipid showed total cholesterol 240, triglycerides 69, HDL 58 and LDL 156.  She also reports of some heart disease in her brother although minimal as he had a coronary calcium score of 2 in his 38s.  Ports fairly healthy diet and is of normal weight.  She exercises with some regularity.  No other cardiovascular risk factors and she is a non-smoker.  PMHx:  Past Medical History:  Diagnosis Date  . Elevated CK   . 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 CESAREAN SECTION    . HERNIA REPAIR    . LASIK      FAMHx:  Family History  Problem Relation Age of Onset  . Diabetes Mother   . Breast cancer Mother 81  . Heart  disease Father     SOCHx:   reports that she has never smoked. She has never used smokeless tobacco. She reports current alcohol use. No history on file for drug use.  ALLERGIES:  No Known Allergies  ROS: Pertinent items noted in HPI and remainder of comprehensive ROS otherwise negative.  HOME MEDS: Current Outpatient Medications on File Prior to Visit  Medication Sig Dispense Refill  . Calcium Carbonate (CALCIUM 500 PO) Take by mouth daily.      . cholecalciferol (VITAMIN D) 1000 UNITS tablet Take 1,000 Units by mouth daily.    . Coenzyme Q10 (CO Q-10) 100 MG CAPS Take by mouth.    . levonorgestrel (MIRENA, 52 MG,) 20 MCG/24HR IUD Mirena 20 mcg/24 hours (5 yrs) 52 mg intrauterine device  Take by intrauterine route.    . Magnesium 400 MG CAPS Take by mouth.     No current facility-administered medications on file prior to visit.    LABS/IMAGING: No results found for this or any previous visit (from the past 48 hour(s)). No results found.  LIPID PANEL:    Component Value Date/Time   CHOL 240 (H) 08/18/2019 1148   TRIG 58 08/18/2019 1148   HDL 69 08/18/2019 1148   CHOLHDL 3.5 08/18/2019 1148   VLDL 11 01/22/2017 1038   LDLCALC 156 (H) 08/18/2019 1148    WEIGHTS: Wt Readings from  Last 3 Encounters:  12/31/19 135 lb (61.2 kg)  10/13/19 132 lb (59.9 kg)  08/28/19 134 lb (60.8 kg)    VITALS: BP 106/74   Pulse 76   Ht 5\' 4"  (1.626 m)   Wt 135 lb (61.2 kg)   SpO2 99%   BMI 23.17 kg/m   EXAM: General appearance: alert and no distress Neck: no carotid bruit, no JVD and thyroid not enlarged, symmetric, no tenderness/mass/nodules Lungs: clear to auscultation bilaterally Heart: regular rate and rhythm, S1, S2 normal, no murmur, click, rub or gallop Abdomen: soft, non-tender; bowel sounds normal; no masses,  no organomegaly Extremities: extremities normal, atraumatic, no cyanosis or edema Pulses: 2+ and symmetric Skin: Skin color, texture, turgor normal. No rashes or  lesions Neurologic: Grossly normal Psych: Pleasant  EKG: Deferred  ASSESSMENT: 1. Mixed dyslipidemia 2. Strong family history of early onset heart disease  PLAN: 65.   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  I suspect we do not need to be overly aggressive with her cardiovascular risk reduction however would recommend coronary calcium scoring to further risk stratify her.  Will likely target LDL to less than 100 if her score is 0 or low, but I do think that it would be reasonable to consider pharmacotherapy since her diet is already optimized and she is in normal weight and physically active.  I do not think that the rosuvastatin was causing her neck and upper back issues.  I would recommend restarting it however every other day for additional benefit.  If she develops further recurrent side effects will need to again trial off the medication to see if her symptoms improve and consider alternatives.  Thanks as always for the kind referral.  Pixie Casino, MD, FACC, [place] Director of the [facility]  [facility] Diplomate of the American Board of Clinical Lipidology Attending Cardiologist  Direct Dial: [phone]  Fax: [phone]  Website:  [email or web address] 12/31/2019, 9:32 AM

## 2019-12-31 NOTE — Patient Instructions (Signed)
Medication Instructions:  START crestor 5mg  every other day  *If you need a refill on your cardiac medications before your next appointment, please call your pharmacy*   Lab Work: FASTING lipid panel in about 3 months - before your next appointment You can go to any [facility] test site  If you have labs (blood work) drawn today and your tests are completely normal, you will receive your results only by: Marland Kitchen MyChart Message (if you have MyChart) OR . A paper copy in the mail If you have any lab test that is abnormal or we need to change your treatment, we will call you to review the results.   Testing/Procedures: Dr. Debara Pickett has ordered a CT coronary calcium score. This test is done at [address]. This is $150 out of pocket.   Coronary CalciumScan A coronary calcium scan is an imaging test used to look for deposits of calcium and other fatty materials (plaques) in the inner lining of the blood vessels of the heart (coronary arteries). These deposits of calcium and plaques can partly clog and narrow the coronary arteries without producing any symptoms or warning signs. This puts a person at risk for a heart attack. This test can detect these deposits before symptoms develop. Tell a health care provider about:  Any allergies you have.  All medicines you are taking, including vitamins, herbs, eye drops, creams, and over-the-counter medicines.  Any problems you or family members have had with anesthetic medicines.  Any blood disorders you have.  Any surgeries you have had.  Any medical conditions you have.  Whether you are pregnant or may be pregnant. What are the risks? Generally, this is a safe procedure. However, problems may occur, including:  Harm to a pregnant woman and her unborn baby. This test involves the use of radiation. Radiation exposure can be dangerous to a pregnant woman and her unborn baby. If you are pregnant, you generally should not have this  procedure done.  Slight increase in the risk of cancer. This is because of the radiation involved in the test. What happens before the procedure? No preparation is needed for this procedure. What happens during the procedure?  You will undress and remove any jewelry around your neck or chest.  You will put on a hospital gown.  Sticky electrodes will be placed on your chest. The electrodes will be connected to an electrocardiogram (ECG) machine to record a tracing of the electrical activity of your heart.  A CT scanner will take pictures of your heart. During this time, you will be asked to lie still and hold your breath for 2-3 seconds while a picture of your heart is being taken. The procedure may vary among health care providers and hospitals. What happens after the procedure?  You can get dressed.  You can return to your normal activities.  It is up to you to get the results of your test. Ask your health care provider, or the department that is doing the test, when your results will be ready. Summary  A coronary calcium scan is an imaging test used to look for deposits of calcium and other fatty materials (plaques) in the inner lining of the blood vessels of the heart (coronary arteries).  Generally, this is a safe procedure. Tell your health care provider if you are pregnant or may be pregnant.  No preparation is needed for this procedure.  A CT scanner will take pictures of your heart.  You can return  to your normal activities after the scan is done. This information is not intended to replace advice given to you by your health care provider. Make sure you discuss any questions you have with your health care provider. Document Released: 12/08/2007 Document Revised: 04/30/2016 Document Reviewed: 04/30/2016 Elsevier Interactive Patient Education  2017 [place]: At [facility], you and your health needs are our priority.  As part of our continuing  mission to provide you with exceptional heart care, we have created designated Provider Care Teams.  These Care Teams include your primary Cardiologist (physician) and Advanced Practice Providers (APPs -  Physician Assistants and Nurse Practitioners) who all work together to provide you with the care you need, when you need it.  We recommend signing up for the patient portal called "MyChart".  Sign up information is provided on this After Visit Summary.  MyChart is used to connect with patients for Virtual Visits (Telemedicine).  Patients are able to view lab/test results, encounter notes, upcoming appointments, etc.  Non-urgent messages can be sent to your provider as well.   To learn more about what you can do with MyChart, go to [email or web address].    Your next appointment:   3 month(s) - lipid clinic  The format for your next appointment:   In Person or Virtual  Provider:   K. Mali Hilty, MD   Other Instructions

## 2020-01-19 ENCOUNTER — Other Ambulatory Visit: Payer: Self-pay

## 2020-01-19 ENCOUNTER — Ambulatory Visit (INDEPENDENT_AMBULATORY_CARE_PROVIDER_SITE_OTHER)
Admission: RE | Admit: 2020-01-19 | Discharge: 2020-01-19 | Disposition: A | Discharge status: Home / Self Care | Payer: Self-pay | Source: Ambulatory Visit | Attending: Internal Medicine | Admitting: Internal Medicine

## 2020-01-19 DIAGNOSIS — Z8249 Family history of ischemic heart disease and other diseases of the circulatory system: Secondary | ICD-10-CM

## 2020-01-19 DIAGNOSIS — E785 Hyperlipidemia, unspecified: Secondary | ICD-10-CM

## 2020-02-11 ENCOUNTER — Other Ambulatory Visit: Payer: Self-pay | Admitting: Internal Medicine

## 2020-02-11 DIAGNOSIS — Z1231 Encounter for screening mammogram for malignant neoplasm of breast: Secondary | ICD-10-CM

## 2020-03-01 ENCOUNTER — Other Ambulatory Visit: Payer: 59 | Admitting: Internal Medicine

## 2020-03-01 ENCOUNTER — Other Ambulatory Visit: Payer: Self-pay

## 2020-03-01 DIAGNOSIS — E7800 Pure hypercholesterolemia, unspecified: Secondary | ICD-10-CM

## 2020-03-01 DIAGNOSIS — Z0000 Encounter for general adult medical examination without abnormal findings: Secondary | ICD-10-CM

## 2020-03-01 DIAGNOSIS — Z1321 Encounter for screening for nutritional disorder: Secondary | ICD-10-CM

## 2020-03-01 DIAGNOSIS — Z1329 Encounter for screening for other suspected endocrine disorder: Secondary | ICD-10-CM

## 2020-03-02 LAB — LIPID PANEL
Cholesterol: 181 mg/dL (ref <200)
HDL: 71 mg/dL (ref >=50)
LDL Cholesterol (Calc): 95 mg/dL (calc)
Non-HDL Cholesterol (Calc): 110 mg/dL (calc) (ref <130)
Total CHOL/HDL Ratio: 2.5 (calc) (ref <5.0)
Triglycerides: 60 mg/dL (ref <150)

## 2020-03-02 LAB — COMPLETE METABOLIC PANEL WITH GFR
AG Ratio: 2 (calc) (ref 1.0–2.5)
ALT: 14 U/L (ref 6–29)
AST: 19 U/L (ref 10–35)
Albumin: 4.9 g/dL (ref 3.6–5.1)
Alkaline phosphatase (APISO): 76 U/L (ref 31–125)
BUN: 18 mg/dL (ref 7–25)
CO2: 29 mmol/L (ref 20–32)
Calcium: 9.9 mg/dL (ref 8.6–10.2)
Chloride: 101 mmol/L (ref 98–110)
Creat: 0.8 mg/dL (ref 0.50–1.10)
GFR, Est African American: 100 mL/min/{1.73_m2} (ref >=60)
GFR, Est Non African American: 87 mL/min/{1.73_m2} (ref >=60)
Globulin: 2.4 g/dL (calc) (ref 1.9–3.7)
Glucose, Bld: 86 mg/dL (ref 65–99)
Potassium: 4.3 mmol/L (ref 3.5–5.3)
Sodium: 139 mmol/L (ref 135–146)
Total Bilirubin: 0.7 mg/dL (ref 0.2–1.2)
Total Protein: 7.3 g/dL (ref 6.1–8.1)

## 2020-03-02 LAB — CBC WITH DIFFERENTIAL/PLATELET
Absolute Monocytes: 413 cells/uL (ref 200–950)
Basophils Absolute: 80 cells/uL (ref 0–200)
Basophils Relative: 1.5 %
Eosinophils Absolute: 302 cells/uL (ref 15–500)
Eosinophils Relative: 5.7 %
HCT: 42.7 % (ref 35.0–45.0)
Hemoglobin: 14.5 g/dL (ref 11.7–15.5)
Lymphs Abs: 1489 cells/uL (ref 850–3900)
MCH: 32.2 pg (ref 27.0–33.0)
MCHC: 34 g/dL (ref 32.0–36.0)
MCV: 94.9 fL (ref 80.0–100.0)
MPV: 9.9 fL (ref 7.5–12.5)
Monocytes Relative: 7.8 %
Neutro Abs: 3016 cells/uL (ref 1500–7800)
Neutrophils Relative %: 56.9 %
Platelets: 320 10*3/uL (ref 140–400)
RBC: 4.5 10*6/uL (ref 3.80–5.10)
RDW: 11.8 % (ref 11.0–15.0)
Total Lymphocyte: 28.1 %
WBC: 5.3 10*3/uL (ref 3.8–10.8)

## 2020-03-02 LAB — VITAMIN D 25 HYDROXY (VIT D DEFICIENCY, FRACTURES): Vit D, 25-Hydroxy: 46 ng/mL (ref 30–100)

## 2020-03-02 LAB — TSH: TSH: 1.3 mIU/L

## 2020-03-04 ENCOUNTER — Other Ambulatory Visit: Payer: Self-pay

## 2020-03-04 ENCOUNTER — Ambulatory Visit (INDEPENDENT_AMBULATORY_CARE_PROVIDER_SITE_OTHER): Payer: 59 | Admitting: Internal Medicine

## 2020-03-04 ENCOUNTER — Encounter: Payer: Self-pay | Admitting: Internal Medicine

## 2020-03-04 VITALS — BP 100/60 | HR 62 | Ht 64.0 in | Wt 135.0 lb

## 2020-03-04 DIAGNOSIS — E7800 Pure hypercholesterolemia, unspecified: Secondary | ICD-10-CM

## 2020-03-04 DIAGNOSIS — Z0000 Encounter for general adult medical examination without abnormal findings: Secondary | ICD-10-CM

## 2020-03-04 LAB — POCT URINALYSIS DIPSTICK
Appearance: NEGATIVE
Bilirubin, UA: NEGATIVE
Blood, UA: NEGATIVE
Glucose, UA: NEGATIVE
Ketones, UA: NEGATIVE
Leukocytes, UA: NEGATIVE
Nitrite, UA: NEGATIVE
Odor: NEGATIVE
Protein, UA: NEGATIVE
Spec Grav, UA: 1.015 (ref 1.010–1.025)
Urobilinogen, UA: 0.2 E.U./dL
pH, UA: 6.5 (ref 5.0–8.0)

## 2020-03-04 NOTE — Progress Notes (Signed)
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  She was seen in early March for follow-up on hyperlipidemia.  History of mildly elevated LDL around 128.  She exercises regularly and watches her diet.  It was thought she had inherited mild hypercholesterolemia.  We tried her on Crestor 5 mg weekly.  She developed some neck and shoulder pain be due to Crestor.  She subsequently went to physical therapy.  She was reluctant to restart Crestor.  She told us she was interested in getting a CT calcium score with family history of heart issues.  She was referred to Dr. Debara Pickett for evaluation of dyslipidemia.  He noted that her father had early onset heart disease.  Dr. Debara Pickett thought that the musculoskeletal pain she had was unlikely to be due to the Crestor.  She had CT cardiac scoring a result was 0 which was excellent.  Since July she has been taking Crestor 5 mg every other day.  Her lipid panel is now normal.  TSH is normal vitamin D is normal CBC and c-Met are normal.  I am very pleased with her labs.  She has GYN physician and has a Mirena IUD in place.  History of traumatic iritis in 2019 seen by Dr. Claudean Kinds.  Gets flu vaccine through employment.  Had tetanus updated 2020.  History of LASIK surgery in 2000.  History of fractured left hand and wrist after a fall in 2007.  Fractured right arm at age 89.  Right lower lobe pneumonia August 2013.  Small umbilical hernia seen by Dr. Talbert Nan in 2013 when she fell should just be observed.  History of incisional hernia repair in 2005 status post C-section in 2005.  In 2011 saw Dr. Claudie Fisherman at [facility] for evaluation of headaches and was diagnosed with migraine with aura.  This improved with discontinuation of oral contraceptives.  Social history: She is married.  Non-smoker.  Social alcohol  consumption.  2 daughters from a previous marriage who are healthy.  She has a stepson.  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  Family history: Father died at age 65 of sudden cardiac arrest.  Mother with history of diabetes.  2 brothers, 1 of whom has increased lipids and hypertension.  She takes vitamin D supplementation.        Review of Systems other than musculoskeletal pain of the neck in April, no new complaints     Objective:   Physical Exam Blood pressure 100/60 pulse 62 pulse oximetry 97% weight 135 pounds height 5 feet 4 inches BMI 23.17  Skin warm and dry.  Nodes none.  TMs are clear.  Neck is supple without JVD thyromegaly or carotid bruits.  Chest clear to auscultation.  Breast without masses.  Cardiac exam regular rate and rhythm normal S1 and S2 without murmurs.  Abdomen soft nondistended without hepatosplenomegaly masses or tenderness.  Pelvic exam deferred to GYN physician.  No lower extremity edema or deformity.  Brief neurological exam intact without focal deficits.  Pure hypercholesterolemia  Family history of heart disease/sudden cardiac death in father  Plan: Would agree she should continue with Crestor as prescribed by Dr. Debara Pickett.  Her lipids have normalized.  Return in 1 year or as needed.  Have Covid booster when available.  Have annual flu vaccine through employment.  Assessment & Plan:

## 2020-03-20 NOTE — Patient Instructions (Signed)
It was a pleasure to see you today.  Your lab work is completely normal.  Continue Crestor as prescribed by Dr. Debara Pickett.  Return in 1 year or as needed.  Have flu vaccine through employment and Covid booster when available.

## 2020-03-25 LAB — HM MAMMOGRAPHY

## 2020-04-01 ENCOUNTER — Ambulatory Visit
Admission: RE | Admit: 2020-04-01 | Discharge: 2020-04-01 | Disposition: A | Discharge status: Home / Self Care | Payer: 59 | Source: Ambulatory Visit | Attending: Internal Medicine | Admitting: Internal Medicine

## 2020-04-01 ENCOUNTER — Other Ambulatory Visit: Payer: Self-pay

## 2020-04-01 DIAGNOSIS — Z1231 Encounter for screening mammogram for malignant neoplasm of breast: Secondary | ICD-10-CM

## 2020-04-02 LAB — LIPID PANEL
Chol/HDL Ratio: 2.2 ratio (ref 0.0–4.4)
Cholesterol, Total: 153 mg/dL (ref 100–199)
HDL: 71 mg/dL (ref >39)
LDL Chol Calc (NIH): 73 mg/dL (ref 0–99)
Triglycerides: 40 mg/dL (ref 0–149)
VLDL Cholesterol Cal: 9 mg/dL (ref 5–40)

## 2020-04-05 ENCOUNTER — Other Ambulatory Visit: Payer: Self-pay

## 2020-04-05 ENCOUNTER — Ambulatory Visit: Payer: 59 | Admitting: Internal Medicine

## 2020-04-05 ENCOUNTER — Encounter: Payer: Self-pay | Admitting: Internal Medicine

## 2020-04-05 VITALS — BP 118/72 | HR 74 | Ht 64.0 in | Wt 141.0 lb

## 2020-04-05 DIAGNOSIS — E785 Hyperlipidemia, unspecified: Secondary | ICD-10-CM | POA: Diagnosis not present

## 2020-04-05 DIAGNOSIS — Z8249 Family history of ischemic heart disease and other diseases of the circulatory system: Secondary | ICD-10-CM

## 2020-04-05 NOTE — Progress Notes (Signed)
LIPID CLINIC CONSULT NOTE  Chief Complaint:  Manage dyslipidemia  Primary Care Physician: Elby Showers, Janet Mason  Primary Cardiologist:  Pixie Casino, Janet Mason  HPI:  Janet Azure, Janet Mason is a 49 y.o. female who is being seen today for the evaluation of dyslipidemia at the request of [place], Cresenciano Lick, Janet Mason.  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  This included her father who had early onset heart disease.  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  She was referred by Dr. Renold Genta for evaluation and management of dyslipidemia.  She was noted to have elevated cholesterol and started on rosuvastatin 5 mg q. weekly back in February.  She then started noticing problems with neck and upper back pain.  She was referred to see Dr. Junius Roads for orthospine evaluation.  Ultimately after undergoing some physical therapy, massage and other modifications she has had some improvement but she also discontinued her statin.  She is concerned that this may have been related as a side effect.  I assured her that I thought that that was unlikely due to the significant in frequency and low-dose of the medication.  That being said her most recent lipid showed total cholesterol 240, triglycerides 69, HDL 58 and LDL 156.  She also reports of some heart disease in her brother although minimal as he had a coronary calcium score of 2 in his 46s.  Ports fairly healthy diet and is of normal weight.  She exercises with some regularity.  No other cardiovascular risk factors and she is a non-smoker.  04/05/2020  Janet Mason returns today for follow-up. Overall she is feeling much better. She reports tolerating rosuvastatin 5 mg every other day. She has had significant reduction in her lipids on this with most recent lipid profile showing total cholesterol 153, HDL 40, LDL 71 and HDL 73. Her coronary calcium score was 0. No  significant extracardiac findings were noted by the radiologist. We discussed the findings and she had no further questions regarding her testing. I recommended that she would not likely need repeat testing for at least 10 years if she wished to reassess it. We also discussed the possibility of not taking statin therapy however since it is well-tolerated and she has had marked improvement in her lipids, she intends to continue with it.  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 She has never used smokeless tobacco. She reports current alcohol use. No history on file for drug use.  ALLERGIES:  No Known Allergies  ROS: Pertinent items noted in HPI and remainder of comprehensive ROS otherwise negative.  HOME MEDS: Current Outpatient Medications on File Prior to Visit  Medication Sig Dispense Refill   Calcium Carb-Cholecalciferol (CALCIUM 1000 + D) 1000-800 MG-UNIT TABS calcium     Calcium Carbonate (CALCIUM 500 PO) Take by mouth daily.       Cholecalciferol (VITAMIN D3) 1.25 MG (50000 UT) CAPS  Vitamin D3     Coenzyme Q10 (CO Q-10) 100 MG CAPS Take by mouth.     levonorgestrel (MIRENA, 52 MG,) 20 MCG/24HR IUD Mirena 20 mcg/24 hours (5 yrs) 52 mg intrauterine device  Take by intrauterine route.     rosuvastatin (CRESTOR) 5 MG tablet Take 1 tablet (5 mg total) by mouth every other day. 45 tablet 3   No current facility-administered medications on file prior to visit.    LABS/IMAGING: No results found for this or any previous visit (from the past 48 hour(s)). No results found.  LIPID PANEL:    Component Value Date/Time   CHOL 153  04/01/2020 0812   TRIG 40 04/01/2020 0812   HDL 71 04/01/2020 0812   CHOLHDL 2.2 04/01/2020 0812   CHOLHDL 2.5 03/01/2020 0927   VLDL 11 01/22/2017 1038   LDLCALC 73 04/01/2020 0812   LDLCALC 95 03/01/2020 0927    WEIGHTS: Wt Readings from Last 3 Encounters:  04/05/20 141 lb (64 kg)  03/04/20 135 lb (61.2 kg)  12/31/19 135 lb (61.2 kg)    VITALS: BP 118/72    Pulse 74    Ht 5\' 4"  (1.626 m)    Wt 141 lb (64 kg)    SpO2 99%    BMI 24.20 kg/m   EXAM: Deferred  EKG: Deferred  ASSESSMENT: 1. Mixed dyslipidemia 2. Strong family history of early onset heart disease 3. 0 CAC score (12/2019)  PLAN: 1.   Janet Mason had a 0 CAC score which is associated with very favorable 10-year prognosis. In addition she has had a significant improvement in her lipids and a combination of diet, exercise and a low-dose statin therapy. Although providers advocate no need for statin therapy, she seems to be tolerating with marked reduction in her lipids. Given her family history of early onset heart disease it seems reasonable to continue this therapy. She could consider repeat calcium scoring in 10 years. I am happy to see her back at any point in the future but she can follow-up with me as needed.  Pixie Casino, Janet Mason, [facility], [place] Director of the [facility]  [facility] Diplomate of the American Board of Clinical Lipidology Attending Cardiologist  Direct Dial: [phone]   Fax: [phone]  Website:  [email or web address] 04/05/2020, 8:15 AM

## 2020-04-05 NOTE — Patient Instructions (Signed)

## 2020-05-06 ENCOUNTER — Ambulatory Visit: Payer: 59 | Attending: Internal Medicine

## 2020-05-06 DIAGNOSIS — Z23 Encounter for immunization: Secondary | ICD-10-CM

## 2020-05-06 NOTE — Progress Notes (Signed)
   Covid-19 Vaccination Clinic  Name:  KALEA PERINE, MD    MRN: [id number] DOB: Jun 05, 1971  05/06/2020  Ms. Breunig was observed post Covid-19 immunization for 15 minutes without incident. She was provided with Vaccine Information Sheet and instruction to access the V-Safe system.   Ms. Russaw was instructed to call 911 with any severe reactions post vaccine: Marland Kitchen Difficulty breathing  . Swelling of face and throat  . A fast heartbeat  . A bad rash all over body  . Dizziness and weakness

## 2020-11-28 ENCOUNTER — Telehealth: Payer: Self-pay | Admitting: Internal Medicine

## 2020-11-28 NOTE — Telephone Encounter (Signed)
Referrals placed 

## 2020-11-28 NOTE — Telephone Encounter (Signed)
Dr Jarvis Newcomer [phone]  Dr Rozann Lesches called to see if she could get an appointment on a Tuesday Morning or anytime on a Friday to talk about her increased Migraine Headaches, could they be related to some degenerative changes in her neck, and also talk about is it time for her to get colonoscopy.Marland Kitchen

## 2020-12-02 ENCOUNTER — Other Ambulatory Visit: Payer: Self-pay | Admitting: Family Medicine

## 2020-12-02 ENCOUNTER — Encounter: Payer: Self-pay | Admitting: Family Medicine

## 2020-12-02 DIAGNOSIS — M542 Cervicalgia: Secondary | ICD-10-CM

## 2020-12-15 ENCOUNTER — Other Ambulatory Visit: Payer: Self-pay | Admitting: Internal Medicine

## 2020-12-16 ENCOUNTER — Encounter: Payer: Self-pay | Admitting: Gastroenterology

## 2020-12-19 LAB — HM PAP SMEAR
HPV 16/18/45 genotyping: NEGATIVE
HPV, high-risk: POSITIVE

## 2020-12-21 ENCOUNTER — Telehealth: Payer: Self-pay | Admitting: Family Medicine

## 2020-12-21 ENCOUNTER — Other Ambulatory Visit: Payer: 59

## 2020-12-21 ENCOUNTER — Encounter: Payer: Self-pay | Admitting: Family Medicine

## 2020-12-21 NOTE — Telephone Encounter (Signed)
Patient states that she went to physical therapy last year for 1 visit and was taught home exercises.  She has been doing them since then.  She does not seem to be making progress, and is having some issues with headaches and muscle twitching in her hands and is being evaluated by neurology.  It is therefore my medical opinion that she should proceed with cervical spine MRI scan.

## 2021-01-06 ENCOUNTER — Other Ambulatory Visit: Payer: Self-pay

## 2021-01-06 ENCOUNTER — Ambulatory Visit (AMBULATORY_SURGERY_CENTER): Payer: 59

## 2021-01-06 VITALS — Ht 64.8 in | Wt 135.0 lb

## 2021-01-06 DIAGNOSIS — Z1211 Encounter for screening for malignant neoplasm of colon: Secondary | ICD-10-CM

## 2021-01-06 MED ORDER — PLENVU 140 G PO SOLR: 1.0000 | ORAL | 0 refills | Status: DC

## 2021-01-06 NOTE — Progress Notes (Signed)
Patient's pre-visit was done today over the phone with the patient due to COVID-19 pandemic. Name,DOB and address verified. Insurance verified. Patient denies any allergies to Eggs and Soy. Patient denies any problems with anesthesia/sedation. Patient denies taking diet pills or blood thinners. No home Oxygen. Packet of Prep instructions mailed to patient including a copy of a consent form-pt is aware. Patient understands to call us back with any questions or concerns. Patient is aware of our care-partner policy and AYGEF-20 safety protocol.   EMMI education assigned to the patient for the procedure, sent to [place].   The patient is COVID-19 vaccinated, per patient.   Coupon was sent to pt's pharmacy.

## 2021-01-10 ENCOUNTER — Ambulatory Visit: Payer: 59 | Admitting: Neurology

## 2021-01-10 ENCOUNTER — Other Ambulatory Visit: Payer: Self-pay

## 2021-01-10 ENCOUNTER — Encounter: Payer: Self-pay | Admitting: Neurology

## 2021-01-10 VITALS — BP 104/66 | HR 69 | Ht 64.8 in | Wt 139.0 lb

## 2021-01-10 DIAGNOSIS — G43709 Chronic migraine without aura, not intractable, without status migrainosus: Secondary | ICD-10-CM | POA: Diagnosis not present

## 2021-01-10 DIAGNOSIS — G43109 Migraine with aura, not intractable, without status migrainosus: Secondary | ICD-10-CM | POA: Diagnosis not present

## 2021-01-10 MED ORDER — AJOVY 225 MG/1.5ML ~~LOC~~ SOAJ: 225.0000 mg | SUBCUTANEOUS | 0 refills | Status: DC

## 2021-01-10 MED ORDER — RIZATRIPTAN BENZOATE 10 MG PO TBDP: 10.0000 mg | ORAL_TABLET | ORAL | 11 refills | Status: DC | PRN

## 2021-01-10 MED ORDER — NURTEC 75 MG PO TBDP: 75.0000 mg | ORAL_TABLET | Freq: Every day | ORAL | 0 refills | Status: DC | PRN

## 2021-01-10 NOTE — Patient Instructions (Addendum)
[email or web address]   There is increased risk for stroke in women with migraine with aura and a contraindication for the combined contraceptive pill for use by women who have migraine with aura. The risk for women with migraine without aura is lower. However other risk factors like smoking are far more likely to increase stroke risk than migraine. There is a recommendation for no smoking and for the use of OCPs without estrogen such as progestogen only pills particularly for women with migraine with aura.Marland Kitchen People who have migraine headaches with auras may be 3 times more likely to have a stroke caused by a blood clot, compared to migraine patients who don't see auras. Women who take hormone-replacement therapy may be 30 percent more likely to suffer a clot-based stroke than women not taking medication containing estrogen. Other risk factors like smoking and high blood pressure may be  much more important.  Meds ordered this encounter  Medications   rizatriptan (MAXALT-MLT) 10 MG disintegrating tablet    Sig: Take 1 tablet (10 mg total) by mouth as needed for migraine. May repeat in 2 hours if needed    Dispense:  9 tablet    Refill:  11   Rimegepant Sulfate (NURTEC) 75 MG TBDP    Sig: Take 75 mg by mouth daily as needed. For migraines. Take as close to onset of migraine as possible. One daily maximum.    Dispense:  4 tablet    Refill:  0   Fremanezumab-vfrm (AJOVY) 225 MG/1.5ML SOAJ    Sig: Inject 225 mg into the skin every 30 (thirty) days.    Dispense:  3 mL    Refill:  0    Patient has copay card; she can have medication regardless of insurance approval or copay amount.     Rimegepant oral dissolving tablet What is this medication? RIMEGEPANT (ri ME je pant) is used to treat migraine headaches with or without aura. An aura is a strange feeling or visual disturbance that warns you of anattack. It is also used to prevent migraine headaches. This medicine may be used for other purposes; ask your  health care provider orpharmacist if you have questions. COMMON BRAND NAME(S): NURTEC ODT What should I tell my care team before I take this medication? They need to know if you have any of these conditions: kidney disease liver disease an unusual or allergic reaction to rimegepant, other medicines, foods, dyes, or preservatives pregnant or trying to get pregnant breast-feeding How should I use this medication? Take the medicine by mouth. Follow the directions on the prescription label. Leave the tablet in the sealed blister pack until you are ready to take it. With dry hands, open the blister and gently remove the tablet. If the tablet breaks or crumbles, throw it away and take a new tablet out of the blister pack. Place the tablet in the mouth and allow it to dissolve, and then swallow. Do not cut, crush, or chew this medicine. You do not need water to take thismedicine. Talk to your pediatrician about the use of this medicine in children. Specialcare may be needed. Overdosage: If you think you have taken too much of this medicine contact apoison control center or emergency room at once. NOTE: This medicine is only for you. Do not share this medicine with others. What if I miss a dose? This does not apply. This medicine is not for regular use. What may interact with this medication? This medicine may interact with the following medications: certain medicines  for fungal infections like fluconazole, itraconazole rifampin This list may not describe all possible interactions. Give your health care provider a list of all the medicines, herbs, non-prescription drugs, or dietary supplements you use. Also tell them if you smoke, drink alcohol, or use illegaldrugs. Some items may interact with your medicine. What should I watch for while using this medication? Visit your health care professional for regular checks on your progress. Tell your health care professional if your symptoms do not start to  get better or ifthey get worse. What side effects may I notice from receiving this medication? Side effects that you should report to your doctor or health care professionalas soon as possible: allergic reactions like skin rash, itching or hives; swelling of the face, lips, or tongue Side effects that usually do not require medical attention (report these toyour doctor or health care professional if they continue or are bothersome): nausea This list may not describe all possible side effects. Call your doctor for medical advice about side effects. You may report side effects to FDA at1-800-FDA-1088. Where should I keep my medication? Keep out of the reach of children and pets. Store at room temperature between 20 and 25 degrees C (68 and 77 degrees F).Get rid of any unused medicine after the expiration date. To get rid of medicines that are no longer needed or have expired: Take the medicine to a medicine take-back program. Check with your pharmacy or law enforcement to find a location. If you cannot return the medicine, check the label or package insert to see if the medicine should be thrown out in the garbage or flushed down the toilet. If you are not sure, ask your health care provider. If it is safe to put it in the trash, take the medicine out of the container. Mix the medicine with cat litter, dirt, coffee grounds, or other unwanted substance. Seal the mixture in a bag or container. Put it in the trash. NOTE: This sheet is a summary. It may not cover all possible information. If you have questions about this medicine, talk to your doctor, pharmacist, orhealth care provider.  2022 Elsevier/Gold Standard (2019-11-24 17:56:55) Rizatriptan Disintegrating Tablets What is this medication? RIZATRIPTAN (rye za TRIP tan) treats migraines. It works by blocking pain signals and narrowing blood vessels in the brain. It belongs to a group ofmedications called triptans. It is not used to prevent  migraines. This medicine may be used for other purposes; ask your health care provider orpharmacist if you have questions. COMMON BRAND NAME(S): Maxalt-MLT What should I tell my care team before I take this medication? They need to know if you have any of these conditions: Cigarette smoker Circulation problems in fingers and toes Diabetes Heart disease High blood pressure High cholesterol History of irregular heartbeat History of stroke Kidney disease Liver disease Stomach or intestine problems An unusual or allergic reaction to rizatriptan, other medications, foods, dyes, or preservatives Pregnant or trying to get pregnant Breast-feeding How should I use this medication? Take this medication by mouth. Follow the directions on the prescription label. Leave the tablet in the sealed blister pack until you are ready to take it. With dry hands, open the blister and gently remove the tablet. If the tablet breaks or crumbles, throw it away and take a new tablet out of the blister pack. Place the tablet in the mouth and allow it to dissolve, and then swallow. Do not cut, crush, or chew this medication. You do not need water to take  thismedication. Do not take it more often than directed. Talk to your care team regarding the use of this medication in children. While this medication may be prescribed for children as young as 6 years for selectedconditions, precautions do apply. Overdosage: If you think you have taken too much of this medicine contact apoison control center or emergency room at once. NOTE: This medicine is only for you. Do not share this medicine with others. What if I miss a dose? This does not apply. This medication is not for regular use. What may interact with this medication? Do not take this medication with any of the following medications: Certain medications for migraine headache like almotriptan, eletriptan, frovatriptan, naratriptan, rizatriptan, sumatriptan,  zolmitriptan Ergot alkaloids like dihydroergotamine, ergonovine, ergotamine, methylergonovine MAOIs like Carbex, Eldepryl, Marplan, Nardil, and Parnate This medication may also interact with the following medications: Certain medications for depression, anxiety, or psychotic disorders Propranolol This list may not describe all possible interactions. Give your health care provider a list of all the medicines, herbs, non-prescription drugs, or dietary supplements you use. Also tell them if you smoke, drink alcohol, or use illegaldrugs. Some items may interact with your medicine. What should I watch for while using this medication? Visit your care team for regular checks on your progress. Tell your care teamif your symptoms do not start to get better or if they get worse. You may get drowsy or dizzy. Do not drive, use machinery, or do anything that needs mental alertness until you know how this medication affects you. Do not stand up or sit up quickly, especially if you are an older patient. This reduces the risk of dizzy or fainting spells. Alcohol may interfere with theeffect of this medication. Your mouth may get dry. Chewing sugarless gum or sucking hard candy and drinking plenty of water may help. Contact your care team if the problem doesnot go away or is severe. If you take migraine medications for 10 or more days a month, your migraines may get worse. Keep a diary of headache days and medication use. Contact yourcare team if your migraine attacks occur more frequently. What side effects may I notice from receiving this medication? Side effects that you should report to your care team as soon as possible: Allergic reactions-skin rash, itching, hives, swelling of the face, lips, tongue, or throat Burning, pain, tingling, or color changes in the legs or feet Heart attack-pain or tightness in the chest, shoulders, arms, or jaw, nausea, shortness of breath, cold or clammy skin, feeling faint or  lightheaded Heart rhythm changes-fast or irregular heartbeat, dizziness, feeling faint or lightheaded, chest pain, trouble breathing Increase in blood pressure Irritability, confusion, fast or irregular heartbeat, muscle stiffness, twitching muscles, sweating, high fever, seizure, chills, vomiting, diarrhea, which may be signs of serotonin syndrome Raynaud's-cool, numb, or painful fingers or toes that may change color from pale, to blue, to red Seizures Stroke-sudden numbness or weakness of the face, arm, or leg, trouble speaking, confusion, trouble walking, loss of balance or coordination, dizziness, severe headache, change in vision Sudden or severe stomach pain, nausea, vomiting, fever, or bloody diarrhea Vision loss Side effects that usually do not require medical attention (report to your careteam if they continue or are bothersome): Dizziness General discomfort or fatigue This list may not describe all possible side effects. Call your doctor for medical advice about side effects. You may report side effects to FDA at1-800-FDA-1088. Where should I keep my medication? Keep out of the reach of children and pets.  Store at room temperature between 15 and 30 degrees C (59 and 86 degrees F). Protect from light and moisture. Throw away any unused medication after theexpiration date. NOTE: This sheet is a summary. It may not cover all possible information. If you have questions about this medicine, talk to your doctor, pharmacist, orhealth care provider.  2022 Elsevier/Gold Standard (2020-07-06 16:19:19)   Rolanda Lundborg injection What is this medication? FREMANEZUMAB (fre ma NEZ ue mab) is used to prevent migraine headaches. This medicine may be used for other purposes; ask your health care provider orpharmacist if you have questions. COMMON BRAND NAME(S): AJOVY What should I tell my care team before I take this medication? They need to know if you have any of these conditions: an unusual or  allergic reaction to fremanezumab, other medicines, foods, dyes, or preservatives pregnant or trying to get pregnant breast-feeding How should I use this medication? This medicine is for injection under the skin. You will be taught how to prepare and give this medicine. Use exactly as directed. Take your medicine atregular intervals. Do not take your medicine more often than directed. It is important that you put your used needles and syringes in a special sharps container. Do not put them in a trash can. If you do not have a sharpscontainer, call your pharmacist or healthcare provider to get one. Talk to your pediatrician regarding the use of this medicine in children.Special care may be needed. Overdosage: If you think you have taken too much of this medicine contact apoison control center or emergency room at once. NOTE: This medicine is only for you. Do not share this medicine with others. What if I miss a dose? If you miss a dose, take it as soon as you can. If it is almost time for yournext dose, take only that dose. Do not take double or extra doses. What may interact with this medication? Interactions are not expected. This list may not describe all possible interactions. Give your health care provider a list of all the medicines, herbs, non-prescription drugs, or dietary supplements you use. Also tell them if you smoke, drink alcohol, or use illegaldrugs. Some items may interact with your medicine. What should I watch for while using this medication? Tell your doctor or healthcare professional if your symptoms do not start toget better or if they get worse. What side effects may I notice from receiving this medication? Side effects that you should report to your doctor or health care professionalas soon as possible: allergic reactions like skin rash, itching or hives, swelling of the face, lips, or tongue Side effects that usually do not require medical attention (report these toyour  doctor or health care professional if they continue or are bothersome): pain, redness, or irritation at site where injected This list may not describe all possible side effects. Call your doctor for medical advice about side effects. You may report side effects to FDA at1-800-FDA-1088. Where should I keep my medication? Keep out of the reach of children. You will be instructed on how to store this medicine. Throw away any unusedmedicine after the expiration date on the label. NOTE: This sheet is a summary. It may not cover all possible information. If you have questions about this medicine, talk to your doctor, pharmacist, orhealth care provider.  2022 Elsevier/Gold Standard (2017-03-11 17:22:56)
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
## 2021-01-19 ENCOUNTER — Encounter: Payer: Self-pay | Admitting: Certified Registered Nurse Anesthetist

## 2021-01-20 ENCOUNTER — Encounter: Payer: Self-pay | Admitting: Gastroenterology

## 2021-01-20 ENCOUNTER — Ambulatory Visit (AMBULATORY_SURGERY_CENTER): Payer: 59 | Admitting: Gastroenterology

## 2021-01-20 ENCOUNTER — Other Ambulatory Visit: Payer: Self-pay

## 2021-01-20 VITALS — BP 105/65 | HR 72 | Temp 98.0°F | Resp 18 | Ht 64.0 in | Wt 135.0 lb

## 2021-01-20 DIAGNOSIS — D123 Benign neoplasm of transverse colon: Secondary | ICD-10-CM

## 2021-01-20 DIAGNOSIS — Z1211 Encounter for screening for malignant neoplasm of colon: Secondary | ICD-10-CM | POA: Diagnosis not present

## 2021-01-20 HISTORY — PX: COLONOSCOPY: SHX174

## 2021-01-20 MED ORDER — SODIUM CHLORIDE 0.9 % IV SOLN: 500.0000 mL | Freq: Once | INTRAVENOUS | Status: DC

## 2021-01-20 NOTE — Op Note (Signed)
[facility] Patient Name: Janet Mason Procedure Date: 01/20/2021 10:44 AM MRN: [id number] Endoscopist: Thornton Park MD, MD Age: 50 Referring MD:  Date of Birth: 1971/04/17 Gender: Female Account #: [id number] Procedure:                Colonoscopy Indications:              Screening for colorectal malignant neoplasm, This                            is the patient's first colonoscopy                           No known family history of colon cancer or polyps Medicines:                Monitored Anesthesia Care Procedure:                Pre-Anesthesia Assessment:                           - Prior to the procedure, a History and Physical                            was performed, and patient medications and                            allergies were reviewed. The patient's tolerance of                            previous anesthesia was also reviewed. The risks                            and benefits of the procedure and the sedation                            options and risks were discussed with the patient.                            All questions were answered, and informed consent                            was obtained. Prior Anticoagulants: The patient has                            taken no previous anticoagulant or antiplatelet                            agents. ASA Grade Assessment: I - A normal, healthy                            patient. After reviewing the risks and benefits,                            the patient was deemed in satisfactory condition to  undergo the procedure.                           After obtaining informed consent, the colonoscope                            was passed under direct vision. Throughout the                            procedure, the patient's blood pressure, pulse, and                            oxygen saturations were monitored continuously. The                            Olympus CF-HQ190L [id number])  Colonoscope was                            introduced through the anus and advanced to the 3                            cm into the ileum. A second forward view of the                            right colon was performed. The colonoscopy was                            performed without difficulty. The patient tolerated                            the procedure well. The quality of the bowel                            preparation was good. The terminal ileum, ileocecal                            valve, appendiceal orifice, and rectum were                            photographed. Scope In: 11:08:18 AM Scope Out: 11:21:58 AM Scope Withdrawal Time: 0 hours 10 minutes 39 seconds  Total Procedure Duration: 0 hours 13 minutes 40 seconds  Findings:                 The perianal and digital rectal examinations were                            normal.                           A 2 mm polyp was found in the distal transverse                            colon. The polyp was sessile. The polyp was removed  with a cold snare. Resection and retrieval were                            complete. Estimated blood loss was minimal.                           The exam was otherwise without abnormality on                            direct and retroflexion views except for small                            internal hemorrhoids. Complications:            No immediate complications. Estimated blood loss:                            Minimal. Estimated Blood Loss:     Estimated blood loss was minimal. Impression:               - One 2 mm polyp in the distal transverse colon,                            removed with a cold snare. Resected and retrieved.                           - The examination was otherwise normal on direct                            and retroflexion views. Recommendation:           - Patient has a contact number available for                            emergencies. The signs  and symptoms of potential                            delayed complications were discussed with the                            patient. Return to normal activities tomorrow.                            Written discharge instructions were provided to the                            patient.                           - Resume previous diet.                           - Continue present medications.                           - Await pathology results.                           -  Repeat colonoscopy date to be determined after                            pending pathology results are reviewed for                            surveillance.                           - Emerging evidence supports eating a diet of                            fruits, vegetables, grains, calcium, and yogurt                            while reducing red meat and alcohol may reduce the                            risk of colon cancer.                           - Thank you for allowing me to be involved in your                            colon cancer prevention. Thornton Park MD, MD 01/20/2021 11:26:09 AM This report has been signed electronically.

## 2021-01-20 NOTE — Patient Instructions (Signed)
Information on polyps and hemorrhoids given to you today.  Await pathology results.  Resume previous diet and medications.  YOU HAD AN ENDOSCOPIC PROCEDURE TODAY AT [facility]:   Refer to the procedure report that was given to you for any specific questions about what was found during the examination.  If the procedure report does not answer your questions, please call your gastroenterologist to clarify.  If you requested that your care partner not be given the details of your procedure findings, then the procedure report has been included in a sealed envelope for you to review at your convenience later.  YOU SHOULD EXPECT: Some feelings of bloating in the abdomen. Passage of more gas than usual.  Walking can help get rid of the air that was put into your GI tract during the procedure and reduce the bloating. If you had a lower endoscopy (such as a colonoscopy or flexible sigmoidoscopy) you may notice spotting of blood in your stool or on the toilet paper. If you underwent a bowel prep for your procedure, you may not have a normal bowel movement for a few days.  Please Note:  You might notice some irritation and congestion in your nose or some drainage.  This is from the oxygen used during your procedure.  There is no need for concern and it should clear up in a day or so.  SYMPTOMS TO REPORT IMMEDIATELY:  Following lower endoscopy (colonoscopy or flexible sigmoidoscopy):  Excessive amounts of blood in the stool  Significant tenderness or worsening of abdominal pains  Swelling of the abdomen that is new, acute  Fever of 100F or higher   For urgent or emergent issues, a gastroenterologist can be reached at any hour by calling [phone]. Do not use MyChart messaging for urgent concerns.    DIET:  We do recommend a small meal at first, but then you may proceed to your regular diet.  Drink plenty of fluids but you should avoid alcoholic beverages for 24  hours.  ACTIVITY:  You should plan to take it easy for the rest of today and you should NOT DRIVE or use heavy machinery until tomorrow (because of the sedation medicines used during the test).    FOLLOW UP: Our staff will call the number listed on your records 48-72 hours following your procedure to check on you and address any questions or concerns that you may have regarding the information given to you following your procedure. If we do not reach you, we will leave a message.  We will attempt to reach you two times.  During this call, we will ask if you have developed any symptoms of COVID 19. If you develop any symptoms (ie: fever, flu-like symptoms, shortness of breath, cough etc.) before then, please call [phone].  If you test positive for Covid 19 in the 2 weeks post procedure, please call and report this information to [place].    If any biopsies were taken you will be contacted by phone or by letter within the next 1-3 weeks.  Please call us at [phone] if you have not heard about the biopsies in 3 weeks.    SIGNATURES/CONFIDENTIALITY: You and/or your care partner have signed paperwork which will be entered into your electronic medical record.  These signatures attest to the fact that that the information above on your After Visit Summary has been reviewed and is understood.  Full responsibility of the confidentiality of this discharge information lies with you and/or your  care-partner.  

## 2021-01-20 NOTE — Progress Notes (Signed)
Report given to PACU, vss 

## 2021-01-20 NOTE — Progress Notes (Signed)
Called to room to assist during endoscopic procedure.  Patient ID and intended procedure confirmed with present staff. Received instructions for my participation in the procedure from the performing physician.  

## 2021-01-20 NOTE — Progress Notes (Signed)
Pt's states no medical or surgical changes since previsit or office visit.  ° °Vitals CW °

## 2021-01-24 ENCOUNTER — Encounter: Payer: Self-pay | Admitting: Internal Medicine

## 2021-01-24 ENCOUNTER — Telehealth: Payer: Self-pay

## 2021-01-24 NOTE — Telephone Encounter (Signed)
  Follow up Call-  Call back number 01/20/2021  Post procedure Call Back phone  # [phone]  Permission to leave phone message Yes  Some recent data might be hidden     Patient questions:  Do you have a fever, pain , or abdominal swelling? No. Pain Score  0 *  Have you tolerated food without any problems? Yes.    Have you been able to return to your normal activities? Yes.    Do you have any questions about your discharge instructions: Diet   No. Medications  No. Follow up visit  No.  Do you have questions or concerns about your Care? No.  Actions: * If pain score is 4 or above: No action needed, pain <4.

## 2021-01-30 ENCOUNTER — Encounter: Payer: Self-pay | Admitting: Gastroenterology

## 2021-02-08 ENCOUNTER — Encounter: Payer: Self-pay | Admitting: Internal Medicine

## 2021-02-21 ENCOUNTER — Other Ambulatory Visit: Payer: Self-pay

## 2021-02-21 ENCOUNTER — Other Ambulatory Visit: Payer: 59 | Admitting: Internal Medicine

## 2021-02-21 DIAGNOSIS — Z1321 Encounter for screening for nutritional disorder: Secondary | ICD-10-CM

## 2021-02-21 DIAGNOSIS — Z0000 Encounter for general adult medical examination without abnormal findings: Secondary | ICD-10-CM

## 2021-02-21 DIAGNOSIS — Z8249 Family history of ischemic heart disease and other diseases of the circulatory system: Secondary | ICD-10-CM

## 2021-02-21 DIAGNOSIS — Z8639 Personal history of other endocrine, nutritional and metabolic disease: Secondary | ICD-10-CM

## 2021-02-21 DIAGNOSIS — M7918 Myalgia, other site: Secondary | ICD-10-CM

## 2021-02-21 DIAGNOSIS — E7800 Pure hypercholesterolemia, unspecified: Secondary | ICD-10-CM

## 2021-02-22 LAB — CBC WITH DIFFERENTIAL/PLATELET
Absolute Monocytes: 302 cells/uL (ref 200–950)
Basophils Absolute: 38 cells/uL (ref 0–200)
Basophils Relative: 0.7 %
Eosinophils Absolute: 184 cells/uL (ref 15–500)
Eosinophils Relative: 3.4 %
HCT: 42.5 % (ref 35.0–45.0)
Hemoglobin: 14.2 g/dL (ref 11.7–15.5)
Lymphs Abs: 1253 cells/uL (ref 850–3900)
MCH: 31.4 pg (ref 27.0–33.0)
MCHC: 33.4 g/dL (ref 32.0–36.0)
MCV: 94 fL (ref 80.0–100.0)
MPV: 10 fL (ref 7.5–12.5)
Monocytes Relative: 5.6 %
Neutro Abs: 3623 cells/uL (ref 1500–7800)
Neutrophils Relative %: 67.1 %
Platelets: 351 10*3/uL (ref 140–400)
RBC: 4.52 10*6/uL (ref 3.80–5.10)
RDW: 12 % (ref 11.0–15.0)
Total Lymphocyte: 23.2 %
WBC: 5.4 10*3/uL (ref 3.8–10.8)

## 2021-02-22 LAB — COMPLETE METABOLIC PANEL WITH GFR
AG Ratio: 1.8 (calc) (ref 1.0–2.5)
ALT: 11 U/L (ref 6–29)
AST: 15 U/L (ref 10–35)
Albumin: 4.9 g/dL (ref 3.6–5.1)
Alkaline phosphatase (APISO): 77 U/L (ref 37–153)
BUN: 16 mg/dL (ref 7–25)
CO2: 29 mmol/L (ref 20–32)
Calcium: 9.8 mg/dL (ref 8.6–10.4)
Chloride: 104 mmol/L (ref 98–110)
Creat: 0.74 mg/dL (ref 0.50–1.03)
Globulin: 2.7 g/dL (calc) (ref 1.9–3.7)
Glucose, Bld: 87 mg/dL (ref 65–99)
Potassium: 4.3 mmol/L (ref 3.5–5.3)
Sodium: 142 mmol/L (ref 135–146)
Total Bilirubin: 0.8 mg/dL (ref 0.2–1.2)
Total Protein: 7.6 g/dL (ref 6.1–8.1)
eGFR: 99 mL/min/{1.73_m2} (ref >=60)

## 2021-02-22 LAB — LIPID PANEL
Cholesterol: 175 mg/dL (ref <200)
HDL: 70 mg/dL (ref >=50)
LDL Cholesterol (Calc): 92 mg/dL (calc)
Non-HDL Cholesterol (Calc): 105 mg/dL (calc) (ref <130)
Total CHOL/HDL Ratio: 2.5 (calc) (ref <5.0)
Triglycerides: 51 mg/dL (ref <150)

## 2021-02-22 LAB — VITAMIN D 25 HYDROXY (VIT D DEFICIENCY, FRACTURES): Vit D, 25-Hydroxy: 73 ng/mL (ref 30–100)

## 2021-02-22 LAB — TSH: TSH: 1.3 mIU/L

## 2021-03-03 ENCOUNTER — Other Ambulatory Visit: Payer: 59 | Admitting: Internal Medicine

## 2021-03-07 ENCOUNTER — Other Ambulatory Visit: Payer: Self-pay | Admitting: Internal Medicine

## 2021-03-07 ENCOUNTER — Ambulatory Visit (INDEPENDENT_AMBULATORY_CARE_PROVIDER_SITE_OTHER): Payer: 59 | Admitting: Internal Medicine

## 2021-03-07 ENCOUNTER — Other Ambulatory Visit: Payer: Self-pay

## 2021-03-07 ENCOUNTER — Encounter: Payer: Self-pay | Admitting: Internal Medicine

## 2021-03-07 VITALS — BP 116/72 | HR 67 | Ht 64.0 in | Wt 135.0 lb

## 2021-03-07 DIAGNOSIS — Z8669 Personal history of other diseases of the nervous system and sense organs: Secondary | ICD-10-CM | POA: Diagnosis not present

## 2021-03-07 DIAGNOSIS — Z8249 Family history of ischemic heart disease and other diseases of the circulatory system: Secondary | ICD-10-CM | POA: Diagnosis not present

## 2021-03-07 DIAGNOSIS — Z0000 Encounter for general adult medical examination without abnormal findings: Secondary | ICD-10-CM | POA: Diagnosis not present

## 2021-03-07 DIAGNOSIS — Z1231 Encounter for screening mammogram for malignant neoplasm of breast: Secondary | ICD-10-CM

## 2021-03-07 DIAGNOSIS — D126 Benign neoplasm of colon, unspecified: Secondary | ICD-10-CM | POA: Diagnosis not present

## 2021-03-07 DIAGNOSIS — E7800 Pure hypercholesterolemia, unspecified: Secondary | ICD-10-CM

## 2021-03-07 LAB — POCT URINALYSIS DIPSTICK
Bilirubin, UA: NEGATIVE
Blood, UA: NEGATIVE
Glucose, UA: NEGATIVE
Ketones, UA: NEGATIVE
Leukocytes, UA: NEGATIVE
Nitrite, UA: NEGATIVE
Protein, UA: NEGATIVE
Spec Grav, UA: <=1.005 — AB (ref 1.010–1.025)
Urobilinogen, UA: 0.2 E.U./dL
pH, UA: 7.5 (ref 5.0–8.0)
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
## 2021-03-24 NOTE — Patient Instructions (Signed)
Recommend COVID booster in the Fall.  Continue medications as prescribed for migraine by Dr. Jaynee Eagles.  Continue Crestor 5 mg every other day.  Recommend flu vaccine.  Return in 1 year or as needed.  It was a pleasure to see you today.

## 2021-03-31 ENCOUNTER — Ambulatory Visit: Payer: 59

## 2021-04-11 ENCOUNTER — Telehealth (INDEPENDENT_AMBULATORY_CARE_PROVIDER_SITE_OTHER): Payer: 59 | Admitting: Neurology

## 2021-04-11 ENCOUNTER — Ambulatory Visit: Payer: 59

## 2021-04-11 DIAGNOSIS — G43709 Chronic migraine without aura, not intractable, without status migrainosus: Secondary | ICD-10-CM | POA: Diagnosis not present

## 2021-04-11 MED ORDER — AJOVY 225 MG/1.5ML ~~LOC~~ SOAJ: 225.0000 mg | SUBCUTANEOUS | 11 refills | Status: DC
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
## 2021-04-18 ENCOUNTER — Ambulatory Visit
Admission: RE | Admit: 2021-04-18 | Discharge: 2021-04-18 | Disposition: A | Discharge status: Home / Self Care | Payer: 59 | Source: Ambulatory Visit | Attending: Internal Medicine | Admitting: Internal Medicine

## 2021-04-18 ENCOUNTER — Other Ambulatory Visit: Payer: Self-pay

## 2021-04-18 ENCOUNTER — Encounter: Payer: Self-pay | Admitting: *Deleted

## 2021-04-18 DIAGNOSIS — Z1231 Encounter for screening mammogram for malignant neoplasm of breast: Secondary | ICD-10-CM

## 2021-04-18 NOTE — Telephone Encounter (Signed)
Completed Ajovy PA on Cover My Meds. Key: [id number]. Awaiting determination from [facility].

## 2021-04-25 ENCOUNTER — Other Ambulatory Visit: Payer: Self-pay | Admitting: Neurology

## 2021-04-25 MED ORDER — TOPIRAMATE 50 MG PO TABS: 50.0000 mg | ORAL_TABLET | Freq: Every evening | ORAL | 3 refills | Status: DC

## 2021-04-26 ENCOUNTER — Telehealth: Payer: Self-pay | Admitting: *Deleted

## 2021-04-26 MED ORDER — AJOVY 225 MG/1.5ML ~~LOC~~ SOAJ: 225.0000 mg | SUBCUTANEOUS | 0 refills | Status: DC

## 2021-04-26 NOTE — Telephone Encounter (Signed)
-----   Message from Melvenia Beam, MD sent at 04/26/2021  1:05 PM EDT ----- Regarding: ajovy samples I am going to give patient 3 months ajovy samples. Please get those ready as she will be coming today or tomorrow. In themeantime I will prescribe Topiramate and some other oral medications an dif she does well on those I will titrate her off of the Ajovy. thanks

## 2021-04-28 ENCOUNTER — Ambulatory Visit: Payer: 59 | Attending: Internal Medicine

## 2021-04-28 DIAGNOSIS — Z23 Encounter for immunization: Secondary | ICD-10-CM

## 2021-04-28 NOTE — Progress Notes (Signed)
   Covid-19 Vaccination Clinic  Name:  Janet GLADE, MD    MRN: [id number] DOB: [birth year]  04/28/2021  [name] was observed post Covid-19 immunization for 15 minutes without incident. She was provided with Vaccine Information Sheet and instruction to access the V-Safe system.   [name] was instructed to call 911 with any severe reactions post vaccine: Difficulty breathing  Swelling of face and throat  A fast heartbeat  A bad rash all over body  Dizziness and weakness   Immunizations Administered     Name Date Dose VIS Date Route   Pfizer Covid-19 Vaccine Bivalent Booster 04/28/2021  1:49 PM 0.3 mL 02/22/2021 Intramuscular   Manufacturer: [place]   Lot: [id number]   [employer]: [phone]

## 2021-05-16 ENCOUNTER — Other Ambulatory Visit (HOSPITAL_BASED_OUTPATIENT_CLINIC_OR_DEPARTMENT_OTHER): Payer: Self-pay

## 2021-05-16 MED ORDER — PFIZER COVID-19 VAC BIVALENT 30 MCG/0.3ML IM SUSP
INTRAMUSCULAR | 0 refills | Status: DC
  Filled 2021-05-16: qty 0.3, 1d supply, fill #0

## 2021-06-26 ENCOUNTER — Encounter: Payer: Self-pay | Admitting: Neurology

## 2021-06-27 NOTE — Telephone Encounter (Signed)
Ajovy PA completed on Cover My Meds. Key: [id number]. Awaiting determination from [facility].

## 2021-06-28 MED ORDER — EMGALITY 120 MG/ML ~~LOC~~ SOAJ: 120.0000 mg | SUBCUTANEOUS | 5 refills | Status: DC

## 2021-06-28 NOTE — Addendum Note (Signed)
Addended by: Gildardo Griffes on: 06/28/2021 02:54 PM   Modules accepted: Orders

## 2021-07-17 NOTE — Telephone Encounter (Signed)
Request Reference Number: [id number]. EMGALITY INJ 120MG /ML is approved through 01/11/2022. Your patient may now fill this prescription and it will be covered.

## 2021-10-20 ENCOUNTER — Ambulatory Visit: Payer: 59 | Admitting: Internal Medicine

## 2021-10-20 ENCOUNTER — Encounter: Payer: Self-pay | Admitting: Internal Medicine

## 2021-10-20 VITALS — BP 98/68 | HR 73 | Temp 97.8°F | Ht 64.0 in | Wt 138.0 lb

## 2021-10-20 DIAGNOSIS — R0683 Snoring: Secondary | ICD-10-CM | POA: Diagnosis not present
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
## 2021-10-20 NOTE — Patient Instructions (Addendum)
Patient will be referred to Pulmonology for evaluation of possible sleep apnea.  Plan to do health maintenance exam later this year. ?

## 2021-11-14 ENCOUNTER — Ambulatory Visit: Payer: 59 | Admitting: Sports Medicine

## 2021-11-17 ENCOUNTER — Encounter: Payer: Self-pay | Admitting: Pulmonary Disease

## 2021-11-17 ENCOUNTER — Ambulatory Visit (INDEPENDENT_AMBULATORY_CARE_PROVIDER_SITE_OTHER): Payer: 59 | Admitting: Pulmonary Disease

## 2021-11-17 VITALS — BP 102/62 | HR 68 | Temp 97.5°F | Ht 64.0 in | Wt 136.8 lb

## 2021-11-17 DIAGNOSIS — R0681 Apnea, not elsewhere classified: Secondary | ICD-10-CM

## 2021-11-17 DIAGNOSIS — R0683 Snoring: Secondary | ICD-10-CM

## 2021-11-17 NOTE — Patient Instructions (Signed)
Schedule for in lab split-night study  We will update your results as soon as reviewed  Tentative follow-up in 3 to 4 months  Living With Sleep Apnea Sleep apnea is a condition in which breathing pauses or becomes shallow during sleep. Sleep apnea is most commonly caused by a collapsed or blocked airway. People with sleep apnea usually snore loudly. They may have times when they gasp and stop breathing for 10 seconds or more during sleep. This may happen many times during the night. The breaks in breathing also interrupt the deep sleep that you need to feel rested. Even if you do not completely wake up from the gaps in breathing, your sleep may not be restful and you feel tired during the day. You may also have a headache in the morning and low energy during the day, and you may feel anxious or depressed. How can sleep apnea affect me? Sleep apnea increases your chances of extreme tiredness during the day (daytime fatigue). It can also increase your risk for health conditions, such as: Heart attack. Stroke. Obesity. Type 2 diabetes. Heart failure. Irregular heartbeat. High blood pressure. If you have daytime fatigue as a result of sleep apnea, you may be more likely to: Perform poorly at school or work. Fall asleep while driving. Have difficulty with attention. Develop depression or anxiety. Have sexual dysfunction. What actions can I take to manage sleep apnea? Sleep apnea treatment  If you were given a device to open your airway while you sleep, use it only as told by your health care provider. You may be given: An oral appliance. This is a custom-made mouthpiece that shifts your lower jaw forward. A continuous positive airway pressure (CPAP) device. This device blows air through a mask when you breathe out (exhale). A nasal expiratory positive airway pressure (EPAP) device. This device has valves that you put into each nostril. A bi-level positive airway pressure (BIPAP) device.  This device blows air through a mask when you breathe in (inhale) and breathe out (exhale). You may need surgery if other treatments do not work for you. Sleep habits Go to sleep and wake up at the same time every day. This helps set your internal clock (circadian rhythm) for sleeping. If you stay up later than usual, such as on weekends, try to get up in the morning within 2 hours of your normal wake time. Try to get at least 7-9 hours of sleep each night. Stop using a computer, tablet, and mobile phone a few hours before bedtime. Do not take long naps during the day. If you nap, limit it to 30 minutes. Have a relaxing bedtime routine. Reading or listening to music may relax you and help you sleep. Use your bedroom only for sleep. Keep your television and computer out of your bedroom. Keep your bedroom cool, dark, and quiet. Use a supportive mattress and pillows. Follow your health care provider's instructions for other changes to sleep habits. Nutrition Do not eat heavy meals in the evening. Do not have caffeine in the later part of the day. The effects of caffeine can last for more than 5 hours. Follow your health care provider's or dietitian's instructions for any diet changes. Lifestyle     Do not drink alcohol before bedtime. Alcohol can cause you to fall asleep at first, but then it can cause you to wake up in the middle of the night and have trouble getting back to sleep. Do not use any products that contain nicotine or  tobacco. These products include cigarettes, chewing tobacco, and vaping devices, such as e-cigarettes. If you need help quitting, ask your health care provider. Medicines Take over-the-counter and prescription medicines only as told by your health care provider. Do not use over-the-counter sleep medicine. You can become dependent on this medicine, and it can make sleep apnea worse. Do not use medicines, such as sedatives and narcotics, unless told by your health  care provider. Activity Exercise on most days, but avoid exercising in the evening. Exercising near bedtime can interfere with sleeping. If possible, spend time outside every day. Natural light helps regulate your circadian rhythm. General information Lose weight if you need to, and maintain a healthy weight. Keep all follow-up visits. This is important. If you are having surgery, make sure to tell your health care provider that you have sleep apnea. You may need to bring your device with you. Where to find more information Learn more about sleep apnea and daytime fatigue from: American Sleep Association: sleepassociation.[place]: sleepfoundation.org National Heart, Lung, and Blood Institute: https://www.hartman-hill.biz/ Summary Sleep apnea is a condition in which breathing pauses or becomes shallow during sleep. Sleep apnea can cause daytime fatigue and other serious health conditions. You may need to wear a device while sleeping to help keep your airway open. If you are having surgery, make sure to tell your health care provider that you have sleep apnea. You may need to bring your device with you. Making changes to sleep habits, diet, lifestyle, and activity can help you manage sleep apnea. This information is not intended to replace advice given to you by your health care provider. Make sure you discuss any questions you have with your health care provider. Document Revised: 01/18/2021 Document Reviewed: 05/20/2020 Elsevier Patient Education  [place].

## 2021-11-17 NOTE — Progress Notes (Signed)
Janet Azure, MD    [id number]    1970-07-14  Primary Care Physician:Baxley, Cresenciano Lick, MD  Referring Physician: Elby Showers, MD [address] [place],  [place] [address]  Chief complaint:   Snoring, apneas  HPI:  History of snoring History of weakness alcoholic snoring Choking episodes at night  Usually goes to bed about 10 to 11 PM Falls asleep in a few minutes At least 2 awakenings Final wake up time about 7 AM  Not able to usually sleep on her back because of choking episodes  2 caffeinated beverages in the morning to get her going  She does have a 20-minute drive to work, no sleepy driving  No significant dryness of her mouth in the mornings She does have headaches Memory is good She does have a pet dog and cat  Both parents snored and siblings do snore  Outpatient Encounter Medications as of 11/17/2021  Medication Sig   Aspirin-Acetaminophen-Caffeine (EXCEDRIN MIGRAINE PO) Take by mouth.   Galcanezumab-gnlm (EMGALITY) 120 MG/ML SOAJ Inject 120 mg into the skin every 30 (thirty) days.   ibuprofen (ADVIL) 200 MG tablet Take 200 mg by mouth every 6 (six) hours as needed.   levonorgestrel (MIRENA) 20 MCG/24HR IUD Mirena 20 mcg/24 hours (5 yrs) 52 mg intrauterine device  Take by intrauterine route.   Rimegepant Sulfate (NURTEC) 75 MG TBDP Take 75 mg by mouth daily as needed. For migraines. Take as close to onset of migraine as possible. One daily maximum.   rizatriptan (MAXALT-MLT) 10 MG disintegrating tablet Take 1 tablet (10 mg total) by mouth as needed for migraine. May repeat in 2 hours if needed   rosuvastatin (CRESTOR) 5 MG tablet TAKE 1 TABLET(5 MG) BY MOUTH EVERY OTHER DAY   VITAMIN D, CHOLECALCIFEROL, PO Take by mouth daily. 5000IU   No facility-administered encounter medications on file as of 11/17/2021.    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    Past Surgical History:  Procedure Laterality Date   CESAREAN SECTION     COLONOSCOPY  01/20/2021   HERNIA REPAIR     LASIK      Family History  Problem Relation Age of Onset   Migraines Mother        sinus headaches (never dx as migraines   Diabetes Mother    Breast cancer Mother 70   Heart disease Father    Breast cancer Maternal Aunt    Colon cancer Neg Hx    Colon polyps Neg Hx    Esophageal cancer Neg Hx    Rectal cancer Neg Hx    Stomach cancer Neg Hx     Social History   Socioeconomic History   Marital status: Married    Spouse name: Not on file   Number of children: Not on file   Years of education: Not on file   Highest education level: Not on file  Occupational History   Not on file  Tobacco Use   Smoking status: Never   Smokeless tobacco: Never  Vaping Use   Vaping Use: Never used  Substance and Sexual Activity   Alcohol use: Yes    Alcohol/week: 2.0 standard drinks    Types: 2 Glasses of  wine per week    Comment: wine few times a week   Drug use: Never   Sexual activity: Yes    Birth control/protection: I.U.D.  Other Topics Concern   Not on file  Social History Narrative   Not on file   Social Determinants of Health   Financial Resource Strain: Not on file  Food Insecurity: Not on file  Transportation Needs: Not on file  Physical Activity: Not on file  Stress: Not on file  Social Connections: Not on file  Intimate Partner Violence: Not on file    Review of Systems  Respiratory:  Positive for apnea.   Psychiatric/Behavioral:  Positive for sleep disturbance.    Vitals:   11/17/21 1357  BP: 102/62  Pulse: 68  Temp: (!) 97.5 F (36.4 C)  SpO2: 99%     Physical Exam Constitutional:      Appearance: Normal appearance.  HENT:     Head: Normocephalic.     Mouth/Throat:     Mouth: Mucous membranes are moist.  Cardiovascular:     Rate and Rhythm:  Normal rate and regular rhythm.     Heart sounds: No murmur heard.   No friction rub.  Pulmonary:     Effort: No respiratory distress.     Breath sounds: No stridor. No wheezing or rhonchi.  Musculoskeletal:     Cervical back: No rigidity or tenderness.  Neurological:     Mental Status: She is alert.  Psychiatric:        Mood and Affect: Mood normal.      11/17/2021    1:00 PM  Results of the Epworth flowsheet  Sitting and reading 1  Watching TV 0  Sitting, inactive in a public place (e.g. a theatre or a meeting) 0  As a passenger in a car for an hour without a break 0  Lying down to rest in the afternoon when circumstances permit 0  Sitting and talking to someone 0  Sitting quietly after a lunch without alcohol 0  In a car, while stopped for a few minutes in traffic 0  Total score 1    Data Reviewed: No previous sleep study  Assessment:  No significant daytime sleepiness  Snoring, witnessed apneas  Moderate probability of obstructive sleep apnea  Body habitus not consistent with significant risk of sleep apnea  Plan/Recommendations: Schedule patient for an in lab polysomnogram  Encouraged good sleep hygiene which she already practices  Tentative follow-up in 3 to 4 months  Call with significant concerns   Sherrilyn Rist MD [facility] 11/17/2021, 2:21 PM  CC: Elby Showers, MD

## 2021-12-13 ENCOUNTER — Encounter: Payer: Self-pay | Admitting: Neurology

## 2022-01-01 ENCOUNTER — Other Ambulatory Visit: Payer: Self-pay | Admitting: Neurology

## 2022-01-01 MED ORDER — EMGALITY 120 MG/ML ~~LOC~~ SOAJ: 120.0000 mg | SUBCUTANEOUS | 5 refills | Status: DC

## 2022-01-22 LAB — HM PAP SMEAR: HPV, high-risk: NEGATIVE

## 2022-01-30 NOTE — Telephone Encounter (Signed)
Completed Emgality renewal PA on Cover My Meds. Key: [id number]. Awaiting determination from [facility].

## 2022-01-31 NOTE — Telephone Encounter (Signed)
Request Reference Number: [id number]. EMGALITY INJ '120MG'$ /ML is approved through 01/31/2023. Your patient may now fill this prescription and it will be covered.

## 2022-03-13 ENCOUNTER — Ambulatory Visit: Payer: 59 | Admitting: Pulmonary Disease

## 2022-03-14 ENCOUNTER — Encounter: Payer: Self-pay | Admitting: Neurology

## 2022-04-17 ENCOUNTER — Telehealth: Payer: 59 | Admitting: Neurology

## 2022-04-17 ENCOUNTER — Telehealth: Payer: Self-pay | Admitting: Neurology

## 2022-04-17 ENCOUNTER — Encounter: Payer: Self-pay | Admitting: *Deleted

## 2022-04-17 DIAGNOSIS — G43709 Chronic migraine without aura, not intractable, without status migrainosus: Secondary | ICD-10-CM | POA: Diagnosis not present

## 2022-04-17 MED ORDER — EMGALITY 120 MG/ML ~~LOC~~ SOAJ: 120.0000 mg | SUBCUTANEOUS | 12 refills | Status: DC

## 2022-04-17 NOTE — Telephone Encounter (Signed)
Give her a video follow up with Janet Mason ina year thanks

## 2022-04-17 NOTE — Telephone Encounter (Signed)
Called pt, scheduled f/u VV with Megan for 04/22/22 at 8:30 am.

## 2022-04-17 NOTE — Progress Notes (Signed)
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  Location: Patient: home Provider: office   I discussed the limitations of evaluation and management by telemedicine and the availability of in person appointments. The patient expressed understanding and agreed to proceed.  Follow Up Instructions:    I discussed the assessment and treatment plan with the patient. The patient was provided an opportunity to ask questions and all were answered. The patient agreed with the plan and demonstrated an understanding of the instructions.   The patient was advised to call back or seek an in-person evaluation if the symptoms worsen or if the condition fails to improve as anticipated.  I provided over 20 minutes of non-face-to-face time during this encounter.   Melvenia Beam, MD  CC:  Migraines  04/17/2022: Doing great on Emgality. She has not had a migraine since April. Fantastic response. She was on Ajovy then switched to [employer]. We recommended PT with [facility] and she reached a plateau and in May she had this extreme neck pain episode and she couldn't move. She took prednisone for a few days, she went to a chiropractor and that has helped her Dr. Raynelle Chary has helped tremendously. She got a new night guard and started wearing it and the PT to build up her shoulder muscles. She has felt very positive. Also massage. She may get an aura discussed risk of stroke and magnesium in the rile of aura.  Patient complains of symptoms per HPI as well as the following symptoms: aura . Pertinent negatives and positives per HPI. All others negative   04/11/2021: Fantastic response to ajovy. Will  prescribe. Maxalt and nurtec didn;t work any better than excedrin. Last time she took Ajovy was September 28th. Discussed copay card. 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  Past medical history elevated LDL, she exercises regularly and watches her diet, has a family history of heart issues, musculoskeletal pain, normal thyroid, normal vitamin D, normal CBC and c-Met Per review of her chart.  She had her first migraine in med school, always started with visual aura and then headache, she has a zig-zag lines, or it stats centrally and then spreads out, in both eyes, lasts 30 minutes and then the headache afterwards. Taking excedrin migraine right away helps. Also more frequent in the last year, she 2 in one week, she had one driving last weekend, affects her vision and a blind spot. The headache is dull throbbing, pressure, nausea, light sensitivity, she feels worse if she coughs or leans over. She had a headache for 4 days. A mirena IUD helped in the past. In 2019 she had 3 migraines, in 2021 she had 4 migraines, this year she has had 15 days of monthly headaches with >8 migraine days a month for 6 months. No medication overuse.  I reviewed Dr. Ardelle Park notes: She is a former patient of Dr. Claudie Fisherman at the wellness center and was diagnosed with migraine with aura, improved with discontinuation of oral contraceptives.  She is here to establish care.  Unfortunately  I reviewed "care everywhere" and could not find any notes from Claudie Fisherman, she must of seen her at the headache wellness center and not in [place].  However she did see ophthalmology in May 2019 after an eye injury and ophthalmic exam including slit lamp, fundus exam, unremarkable, she had an abrasion of the left cornea.  Reviewed notes, labs and imaging from outside physicians, which showed:  From a thorough review of records,  medications tried that can be used in migraine management include: Excedrin, ibuprofen, magnesium, topiramate, nortriptyline,propranolol contraindicated due to hypotension, imitrex, maxalt.   Review of Systems: Patient complains of symptoms per HPI as well as the following symptoms: headache . Pertinent negatives and positives per HPI. All others negative    Social History   Socioeconomic History   Marital status: Married    Spouse name: Not on file   Number of children: Not on file   Years of education: Not on file   Highest education level: Not on file  Occupational History   Not on file  Tobacco Use   Smoking status: Never   Smokeless tobacco: Never  Vaping Use   Vaping Use: Never used  Substance and Sexual Activity   Alcohol use: Yes    Alcohol/week: 2.0 standard drinks of alcohol    Types: 2 Glasses of wine per week    Comment: wine few times a week   Drug use: Never   Sexual activity: Yes    Birth control/protection: I.U.D.  Other Topics Concern   Not on file  Social History Narrative   Not on file   Social Determinants of Health   Financial Resource Strain: Not on file  Food Insecurity: Not on file  Transportation Needs: Not on file  Physical Activity: Not on file  Stress: Not on file  Social Connections: Not on file  Intimate Partner Violence: Not on file    Family History  Problem Relation Age of Onset   Migraines Mother        sinus headaches (never dx as migraines   Diabetes Mother    Breast cancer Mother 75   Heart disease Father    Breast cancer Maternal Aunt    Colon cancer Neg Hx    Colon polyps Neg Hx    Esophageal cancer Neg Hx    Rectal cancer Neg Hx    Stomach cancer Neg Hx     Past Medical History:  Diagnosis Date   Allergy    Elevated CK    Head ache    Hyperlipidemia    Migraine    Palpitations    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    Patient Active Problem List   Diagnosis Date Noted   Chronic migraine without aura without status  migrainosus, not intractable 01/10/2021   Vitamin D deficiency disease 10/09/2019   Cyst of ovary 08/26/2018   IUD contraception - inserted 04/18/10 56/81/2751   Umbilical hernia 70/06/7492   Abnormal cervical Papanicolaou smear 06/25/1989    Past Surgical History:  Procedure Laterality Date   CESAREAN SECTION     COLONOSCOPY  01/20/2021   HERNIA REPAIR     LASIK      Current Outpatient Medications  Medication Sig Dispense Refill   Galcanezumab-gnlm (EMGALITY) 120 MG/ML SOAJ Inject 120 mg into the skin every 30 (thirty) days. 1 mL 12   ibuprofen (ADVIL) 200 MG tablet Take 200 mg by mouth every 6 (six) hours as needed.     levonorgestrel (MIRENA) 20 MCG/24HR IUD Mirena 20 mcg/24  hours (5 yrs) 52 mg intrauterine device  Take by intrauterine route.     rosuvastatin (CRESTOR) 5 MG tablet TAKE 1 TABLET(5 MG) BY MOUTH EVERY OTHER DAY 45 tablet 2   VITAMIN D, CHOLECALCIFEROL, PO Take by mouth daily. 5000IU     No current facility-administered medications for this visit.    Allergies as of 04/17/2022   (No Known Allergies)    Vitals: There were no vitals taken for this visit. Last Weight:  Wt Readings from Last 1 Encounters:  11/17/21 136 lb 12.8 oz (62.1 kg)   Last Height:   Ht Readings from Last 1 Encounters:  11/17/21 '5\' 4"'  (1.626 m)    Physical exam: Exam: Gen: NAD, conversant      CV: Denies palpitations or chest pain or SOB. VS: Breathing at a normal rate. Weight appears within normal limits. Not febrile. Eyes: Conjunctivae clear without exudates or hemorrhage  Neuro: Detailed Neurologic Exam  Speech:    Speech is normal; fluent and spontaneous with normal comprehension.  Cognition:    The patient is oriented to person, place, and time;     recent and remote memory intact;     language fluent;     normal attention, concentration,     fund of knowledge Cranial Nerves:    The pupils are equal, round, and reactive to light.  Visual fields are full to finger  confrontation. Extraocular movements are intact.  The face is symmetric with normal sensation. The palate elevates in the midline. Hearing intact. Voice is normal. Shoulder shrug is normal. The tongue has normal motion without fasciculations.   Coordination:    Normal finger to nose  Gait:    Normal native gait  Motor Observation:   no involuntary movements noted. Tone:    Appears normal  Posture:    Posture is normal. normal erect    Strength:    Strength is anti-gravity and symmetric in the upper and lower limbs.      Sensation: intact to LT        Assessment/Plan:  Lovely patient with chronic migraines. Had a talk about migraine management, lifestyle, options for treatment, environmental factors, answered all questions. Doing great on Emgality, continue.   Acute: Maxalt never really helped more than excedrin. Gave nurtec samples and didn't help anymore than excedrin, she hasn't had a migraine since April so doesn't need acute at this time  Doing great on Emgality. She has not had a migraine since April. Fantastic response. She was on Ajovy then switched to [employer]. We recommended PT with [facility] and she reached a plateau and in May she had this extreme neck pain episode and she couldn't move. She took prednisone for a few days, she went to a chiropractor and that has helped her Dr. Raynelle Chary has helped tremendously. She got a new night guard and started wearing it and the PT to build up her shoulder muscles. She has felt very positive. Also massage. She may get an aura discussed risk of stroke and magnesium in the rile of aura.  DISCUSSED THE FOLLOWING:  There is increased risk for stroke in women with migraine with aura and a contraindication for the combined contraceptive pill for use by women who have migraine with aura. The risk for women with migraine without aura is lower. However other risk factors like smoking are far more likely to increase stroke risk than migraine. There is  a recommendation for no smoking and for the use of OCPs without estrogen such  as progestogen only pills particularly for women with migraine with aura.Marland Kitchen People who have migraine headaches with auras may be 3 times more likely to have a stroke caused by a blood clot, compared to migraine patients who don't see auras. Women who take hormone-replacement therapy may be 30 percent more likely to suffer a clot-based stroke than women not taking medication containing estrogen. Other risk factors like smoking and high blood pressure may be  much more important.  We discussed migraine with aura, gave information on AVS for aura and also role of magnesium in migraine aura   Meds ordered this encounter  Medications   Galcanezumab-gnlm (EMGALITY) 120 MG/ML SOAJ    Sig: Inject 120 mg into the skin every 30 (thirty) days.    Dispense:  1 mL    Refill:  12    Cancel Ajovy, insurance prefers Emgality     Discussed risk of stroke in women with migraine with aura  Discussed: To prevent or relieve headaches, try the following: Cool Compress. Lie down and place a cool compress on your head.  Avoid headache triggers. If certain foods or odors seem to have triggered your migraines in the past, avoid them. A headache diary might help you identify triggers.  Include physical activity in your daily routine. Try a daily walk or other moderate aerobic exercise.  Manage stress. Find healthy ways to cope with the stressors, such as delegating tasks on your to-do list.  Practice relaxation techniques. Try deep breathing, yoga, massage and visualization.  Eat regularly. Eating regularly scheduled meals and maintaining a healthy diet might help prevent headaches. Also, drink plenty of fluids.  Follow a regular sleep schedule. Sleep deprivation might contribute to headaches Consider biofeedback. With this mind-body technique, you learn to control certain bodily functions -- such as muscle tension, heart rate and blood  pressure -- to prevent headaches or reduce headache pain.    Proceed to emergency room if you experience new or worsening symptoms or symptoms do not resolve, if you have new neurologic symptoms or if headache is severe, or for any concerning symptom.   Provided education and documentation from American headache Society toolbox including articles on: chronic migraine medication overuse headache, chronic migraines, prevention of migraines, behavioral and other nonpharmacologic treatments for headache.   Meds ordered this encounter  Medications   Galcanezumab-gnlm (EMGALITY) 120 MG/ML SOAJ    Sig: Inject 120 mg into the skin every 30 (thirty) days.    Dispense:  1 mL    Refill:  12    Cancel Ajovy, insurance prefers [place]      Cc: Baxley, Cresenciano Lick, MD,  Baxley, Cresenciano Lick, MD  Sarina Ill, MD  [facility] [address] [place], [place] [address]  Phone [phone] Fax [phone]

## 2022-04-17 NOTE — Patient Instructions (Signed)
There is increased risk for stroke in women with migraine with aura and a contraindication for the combined contraceptive pill for use by women who have migraine with aura. The risk for women with migraine without aura is lower. However other risk factors like smoking are far more likely to increase stroke risk than migraine. There is a recommendation for no smoking and for the use of OCPs without estrogen such as progestogen only pills particularly for women with migraine with aura.Marland Kitchen People who have migraine headaches with auras may be 3 times more likely to have a stroke caused by a blood clot, compared to migraine patients who don't see auras. Women who take hormone-replacement therapy may be 30 percent more likely to suffer a clot-based stroke than women not taking medication containing estrogen. Other risk factors like smoking and high blood pressure may be  much more important.  [email or web address]  Is taking magnesium good for migraine? The ideal medication for prevention and treatment of migraine would have no side effects, no risk, would be safe in pregnancy, as well as be highly effective while remaining inexpensive. Of course, no such medication exists, but magnesium comes closer than many interventions on all these fronts.  What form of magnesium is best for migraine? Magnesium oxide is frequently used in pill form to prevent migraine, usually at a dose of 400-600 mg per day. Acutely, it can be dosed in pill form at the same dosage or given intravenously as magnesium sulfate at 1-2 gm. The most frequent side effect is diarrhea, which can be helpful in those prone to constipation. Diarrhea and abdominal cramping that is sometimes experienced is dose-responsive, such that a lower dose or decreasing the frequency of intake usually takes care of the problem.  Magnesium oxide in doses up to 400 mg is pregnancy category A,  which means it can be used safely in pregnancy. Magnesium sulfate, typically given intravenously, now carries a warning related to bone thinning seen in the developing fetus when used longer than 5-7 days in a row. This was discovered in the context of high doses being given to pregnant women to prevent preterm labor.  The most substantial evidence for magnesium's effectiveness is in patients who have or have had aura with their migraine. It is believed magnesium may prevent the wave of brain signaling, called cortical spreading depression, which produces the visual and sensory changes in the common forms of aura. Other mechanisms of magnesium action include improved platelet function and decreased release or blocking of pain transmitting chemicals in the brain such as Substance P and glutamate. Magnesium may also prevent the narrowing of brain blood vessels caused by the neurotransmitter serotonin.  Daily oral magnesium has also been shown to prevent menstrually related migraine, especially in those with premenstrual migraine. This means that preventive use can target those with aura or those with menstrually related migraine, even for those with irregular cycles.  It is challenging to measure magnesium levels accurately, as levels in the bloodstream may represent only 2% of total body stores, with the rest of magnesium stored in the bones or within cells. Most importantly, simple magnesium blood levels do not accurately measure magnesium levels in the brain. This has led to uncertainty concerning whether correcting a low magnesium level is necessary in treatment or whether magnesium effectiveness is even related to low blood levels in the first place. Measurement of ionized magnesium or red blood cell magnesium levels is thought to be more accurate, but these laboratory tests are more  difficult and expensive to obtain.  Can low magnesium cause migraine? Because magnesium may not be accurately measured,  low magnesium in the brain can be difficult to prove. Those prone to low magnesium include people with heart disease, diabetes, alcoholism, and those on diuretics for blood pressure. There is some evidence that migraineurs may have lower brain magnesium levels either from decreased absorption of it in food, a genetic tendency to low brain magnesium, or from excreting it from the body to a greater degree than non-migraineurs. Studies of migraineurs have found low levels of brain and spinal fluid magnesium in between migraine attacks.  In 2012, the American Headache Society and the [place] Academy [employer] Neu[employer] reviewed the studies on medications used for migraine prevention and gave magnesium a Level B rating; that is, it is probably effective and should be considered for patients requiring migraine preventive therapy. Because of its safety profile and the lack of serious side effects, magnesium is often chosen as a preventive strategy either alone or with other preventive medications.  Magnesium has also been studied for the acute, as-needed treatment of severe, difficult-to-treat migraine. Magnesium sulfate given intravenously was most effective in those with a history of migraine with aura. In those without a history of aura, no difference was seen in immediate pain relief or nausea relief by magnesium. Still, there was less light and noise sensitivity after the infusion.  Magnesium oxide, in tablet form, is inexpensive, does not require a prescription, and may be considered reasonable prevention in those who have a history of aura, menstrually related migraine, no health insurance, or who may become pregnant. Because of the excellent safety profile of magnesium, any patient who has frequent migraine and is considering a preventive strategy to reduce the frequency or severity of their headaches may want to consider this option and discuss it with their physician.

## 2022-04-24 ENCOUNTER — Ambulatory Visit
Admission: RE | Admit: 2022-04-24 | Discharge: 2022-04-24 | Disposition: A | Discharge status: Home / Self Care | Payer: 59 | Source: Ambulatory Visit | Attending: Internal Medicine | Admitting: Internal Medicine

## 2022-05-11 ENCOUNTER — Other Ambulatory Visit: Payer: 59

## 2022-05-11 DIAGNOSIS — Z0000 Encounter for general adult medical examination without abnormal findings: Secondary | ICD-10-CM

## 2022-05-11 DIAGNOSIS — Z136 Encounter for screening for cardiovascular disorders: Secondary | ICD-10-CM

## 2022-05-11 DIAGNOSIS — E7800 Pure hypercholesterolemia, unspecified: Secondary | ICD-10-CM

## 2022-05-11 DIAGNOSIS — R5383 Other fatigue: Secondary | ICD-10-CM

## 2022-05-12 LAB — COMPLETE METABOLIC PANEL WITH GFR
AG Ratio: 1.8 (calc) (ref 1.0–2.5)
ALT: 9 U/L (ref 6–29)
AST: 13 U/L (ref 10–35)
Albumin: 4.8 g/dL (ref 3.6–5.1)
Alkaline phosphatase (APISO): 96 U/L (ref 37–153)
BUN: 14 mg/dL (ref 7–25)
CO2: 30 mmol/L (ref 20–32)
Calcium: 9.9 mg/dL (ref 8.6–10.4)
Chloride: 102 mmol/L (ref 98–110)
Creat: 0.67 mg/dL (ref 0.50–1.03)
Globulin: 2.6 g/dL (calc) (ref 1.9–3.7)
Glucose, Bld: 86 mg/dL (ref 65–99)
Potassium: 4.5 mmol/L (ref 3.5–5.3)
Sodium: 140 mmol/L (ref 135–146)
Total Bilirubin: 0.8 mg/dL (ref 0.2–1.2)
Total Protein: 7.4 g/dL (ref 6.1–8.1)
eGFR: 106 mL/min/{1.73_m2} (ref >=60)

## 2022-05-12 LAB — CBC WITH DIFFERENTIAL/PLATELET
Absolute Monocytes: 275 cells/uL (ref 200–950)
Basophils Absolute: 60 cells/uL (ref 0–200)
Basophils Relative: 1.2 %
Eosinophils Absolute: 375 cells/uL (ref 15–500)
Eosinophils Relative: 7.5 %
HCT: 41.2 % (ref 35.0–45.0)
Hemoglobin: 13.8 g/dL (ref 11.7–15.5)
Lymphs Abs: 1485 cells/uL (ref 850–3900)
MCH: 31.2 pg (ref 27.0–33.0)
MCHC: 33.5 g/dL (ref 32.0–36.0)
MCV: 93.2 fL (ref 80.0–100.0)
MPV: 9.8 fL (ref 7.5–12.5)
Monocytes Relative: 5.5 %
Neutro Abs: 2805 cells/uL (ref 1500–7800)
Neutrophils Relative %: 56.1 %
Platelets: 344 10*3/uL (ref 140–400)
RBC: 4.42 10*6/uL (ref 3.80–5.10)
RDW: 11.9 % (ref 11.0–15.0)
Total Lymphocyte: 29.7 %
WBC: 5 10*3/uL (ref 3.8–10.8)

## 2022-05-12 LAB — LIPID PANEL
Cholesterol: 210 mg/dL — ABNORMAL HIGH (ref <200)
HDL: 73 mg/dL (ref >=50)
LDL Cholesterol (Calc): 123 mg/dL (calc) — ABNORMAL HIGH
Non-HDL Cholesterol (Calc): 137 mg/dL (calc) — ABNORMAL HIGH (ref <130)
Total CHOL/HDL Ratio: 2.9 (calc) (ref <5.0)
Triglycerides: 47 mg/dL (ref <150)

## 2022-05-12 LAB — TSH: TSH: 1.39 mIU/L

## 2022-05-15 ENCOUNTER — Encounter: Payer: Self-pay | Admitting: Neurology

## 2022-05-15 ENCOUNTER — Encounter: Payer: Self-pay | Admitting: Internal Medicine

## 2022-05-15 ENCOUNTER — Ambulatory Visit (INDEPENDENT_AMBULATORY_CARE_PROVIDER_SITE_OTHER): Payer: 59 | Admitting: Internal Medicine

## 2022-05-15 VITALS — BP 108/76 | HR 70 | Temp 98.1°F | Ht 64.0 in | Wt 140.8 lb

## 2022-05-15 DIAGNOSIS — Z0000 Encounter for general adult medical examination without abnormal findings: Secondary | ICD-10-CM

## 2022-05-15 DIAGNOSIS — M542 Cervicalgia: Secondary | ICD-10-CM

## 2022-05-15 DIAGNOSIS — Z8249 Family history of ischemic heart disease and other diseases of the circulatory system: Secondary | ICD-10-CM

## 2022-05-15 DIAGNOSIS — Z8669 Personal history of other diseases of the nervous system and sense organs: Secondary | ICD-10-CM | POA: Diagnosis not present

## 2022-05-15 DIAGNOSIS — E7800 Pure hypercholesterolemia, unspecified: Secondary | ICD-10-CM | POA: Diagnosis not present

## 2022-05-15 DIAGNOSIS — D126 Benign neoplasm of colon, unspecified: Secondary | ICD-10-CM

## 2022-05-15 LAB — POCT URINALYSIS DIPSTICK
Bilirubin, UA: NEGATIVE
Blood, UA: NEGATIVE
Glucose, UA: NEGATIVE
Ketones, UA: NEGATIVE
Leukocytes, UA: NEGATIVE
Nitrite, UA: NEGATIVE
Protein, UA: NEGATIVE
Spec Grav, UA: 1.01 (ref 1.010–1.025)
Urobilinogen, UA: 0.2 E.U./dL
pH, UA: 5 (ref 5.0–8.0)
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
## 2022-05-16 NOTE — Telephone Encounter (Signed)
Pt scheduled with Dr. Jaynee Eagles for 06/01/22 at 10:15am

## 2022-05-18 ENCOUNTER — Other Ambulatory Visit: Payer: Self-pay | Admitting: Internal Medicine

## 2022-06-01 ENCOUNTER — Ambulatory Visit (INDEPENDENT_AMBULATORY_CARE_PROVIDER_SITE_OTHER): Payer: 59 | Admitting: Neurology

## 2022-06-01 VITALS — BP 125/78 | HR 69 | Ht 64.0 in | Wt 140.0 lb

## 2022-06-01 DIAGNOSIS — M6289 Other specified disorders of muscle: Secondary | ICD-10-CM

## 2022-06-01 DIAGNOSIS — R590 Localized enlarged lymph nodes: Secondary | ICD-10-CM

## 2022-06-01 DIAGNOSIS — R221 Localized swelling, mass and lump, neck: Secondary | ICD-10-CM

## 2022-06-01 DIAGNOSIS — C760 Malignant neoplasm of head, face and neck: Secondary | ICD-10-CM

## 2022-06-01 DIAGNOSIS — G8929 Other chronic pain: Secondary | ICD-10-CM

## 2022-06-01 DIAGNOSIS — K1379 Other lesions of oral mucosa: Secondary | ICD-10-CM

## 2022-06-01 DIAGNOSIS — M542 Cervicalgia: Secondary | ICD-10-CM

## 2022-06-01 DIAGNOSIS — R200 Anesthesia of skin: Secondary | ICD-10-CM

## 2022-06-01 DIAGNOSIS — M5412 Radiculopathy, cervical region: Secondary | ICD-10-CM
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
## 2022-06-01 NOTE — Patient Instructions (Addendum)
MRI cervical spine MRI soft tissue of the neck Could always image head if we don;t get the lower jaw/tongue or parotid glad in the MRI soft tissue

## 2022-06-03 ENCOUNTER — Encounter: Payer: Self-pay | Admitting: Neurology

## 2022-06-11 ENCOUNTER — Telehealth: Payer: Self-pay | Admitting: Neurology

## 2022-06-11 IMAGING — MG DIGITAL SCREENING BILAT W/ TOMO W/ CAD
8 series · 9 of 24 positions shown · non-contrast
Comparison: Previous exam(s).

CLINICAL DATA: Screening.

EXAM:
DIGITAL SCREENING BILATERAL MAMMOGRAM WITH TOMO AND CAD

[R MLO synth-2D]
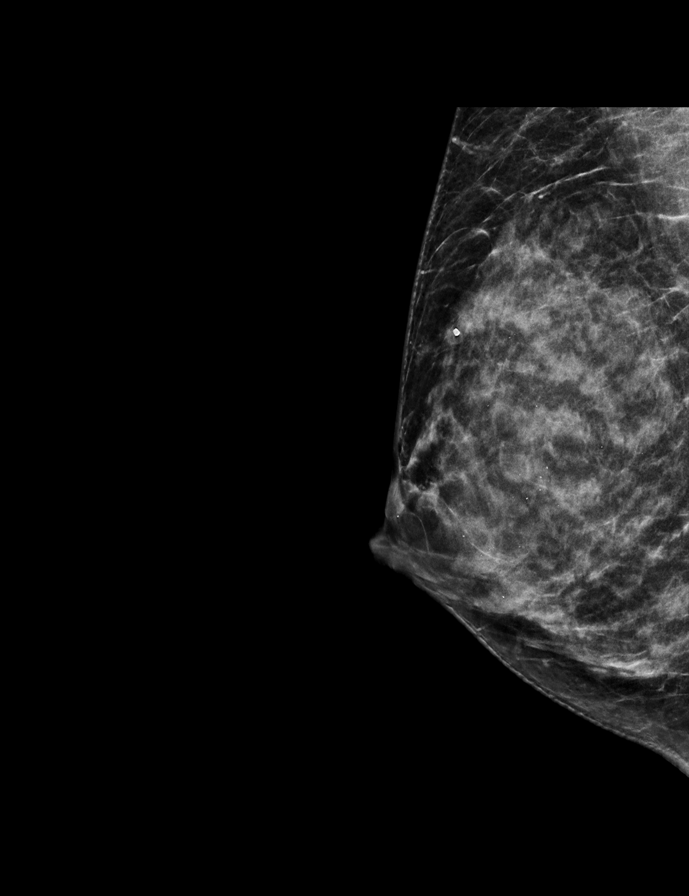

[L MLO synth-2D]
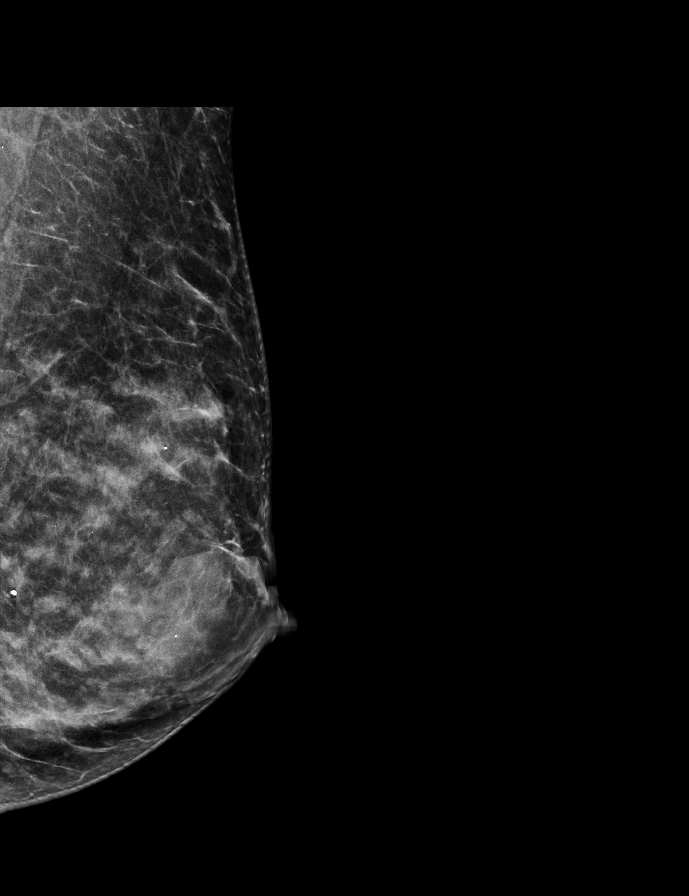

[L CC synth-2D]
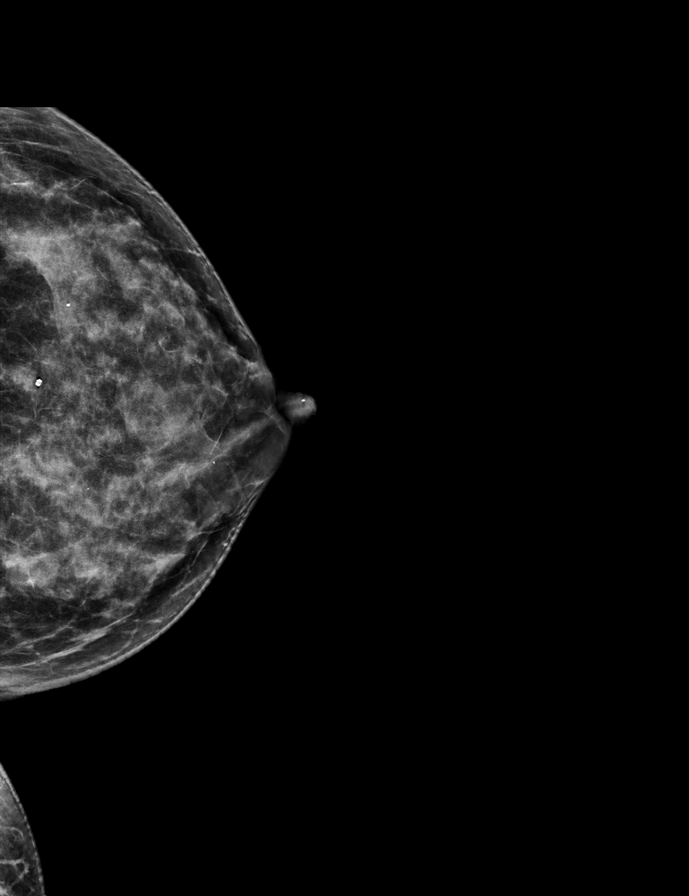

[R CC synth-2D]
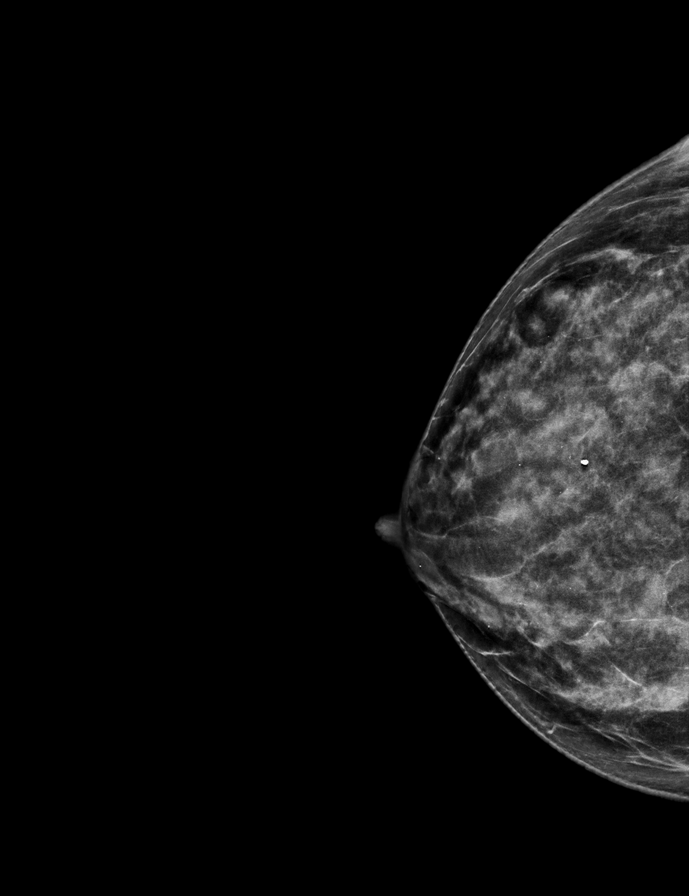

[L CC tomo · 2 of 58 frames shown]
[frame 19/58]
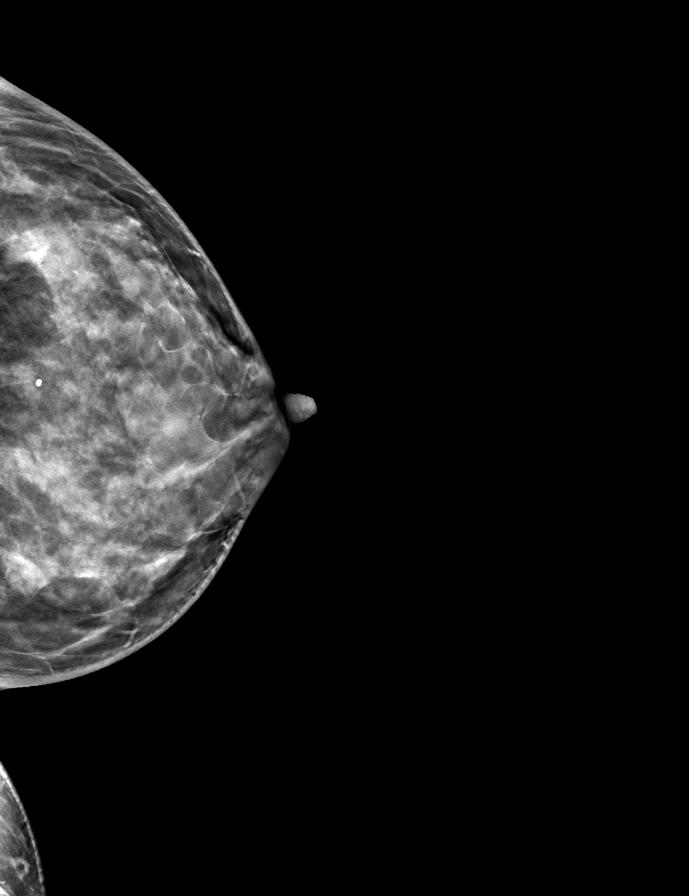
[frame 29/58]
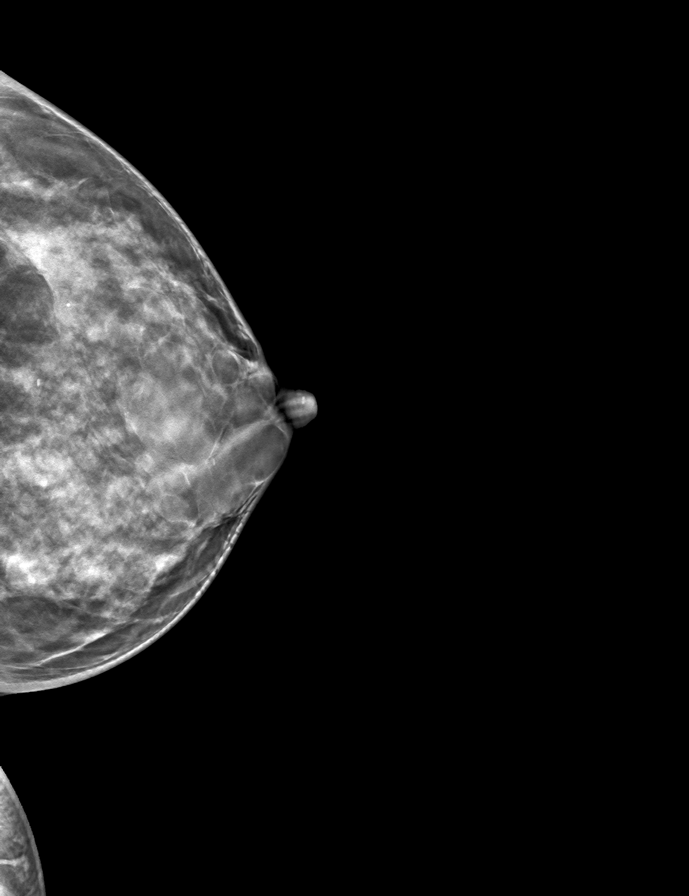

[R MLO tomo · tomo slice 27/53.0]
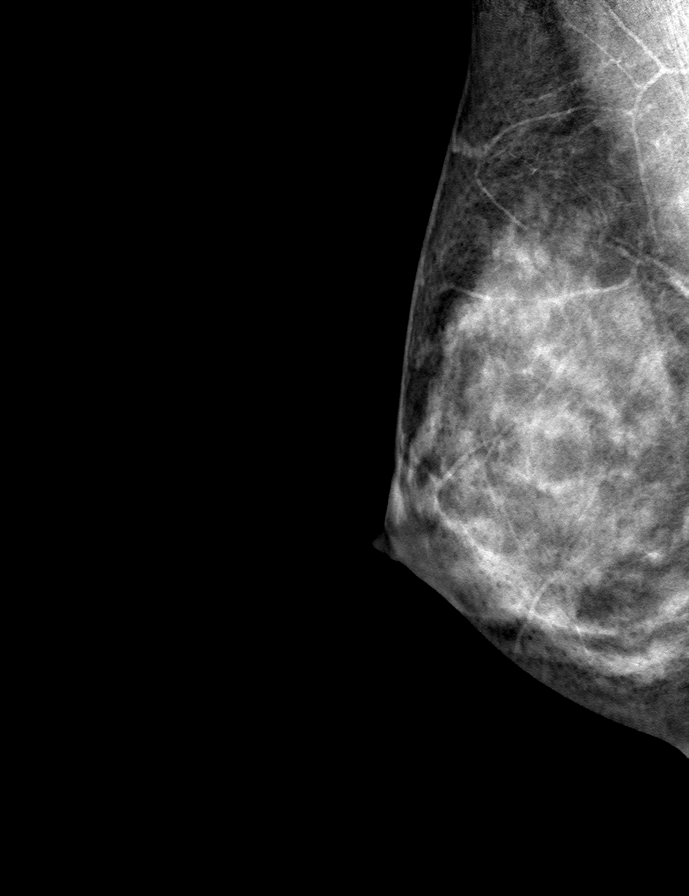

[R CC tomo · tomo slice 30/59.0]
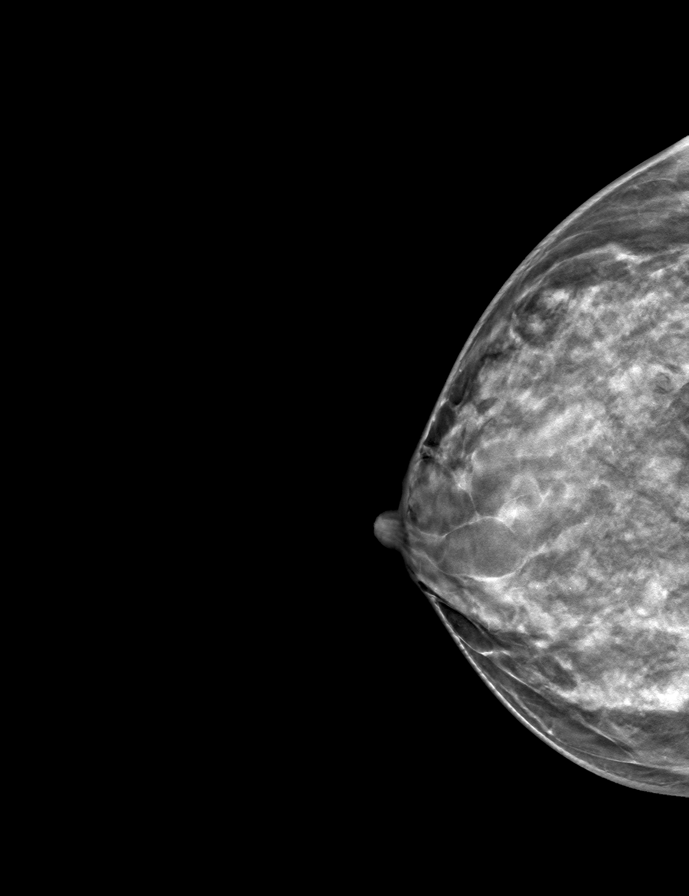

[L MLO tomo · tomo slice 28/55.0]
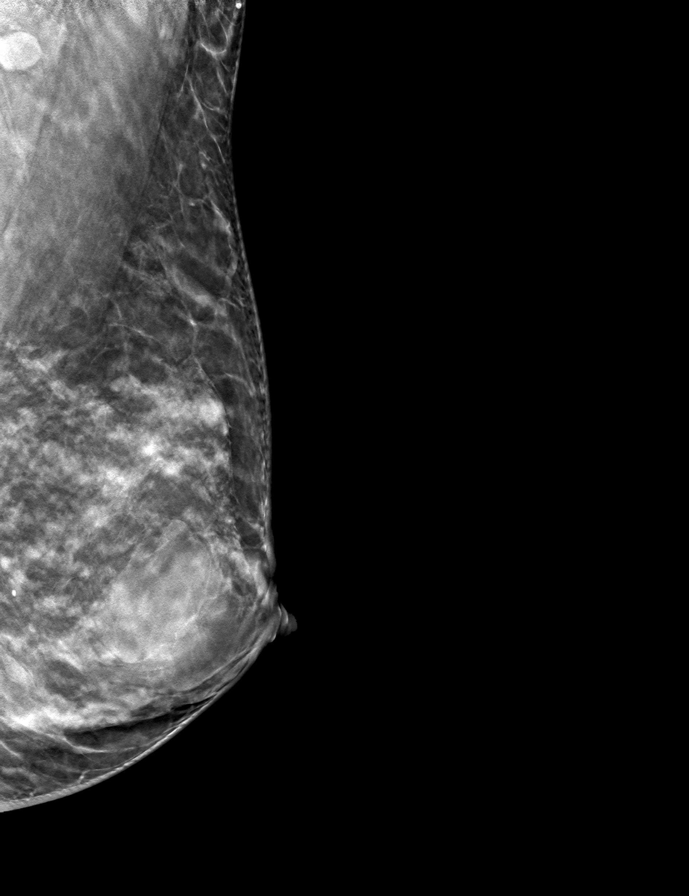

[9 of 24 positions shown; findings below may reference images not displayed]

ACR Breast Density Category c: The breast tissue is heterogeneously
dense, which may obscure small masses.
FINDINGS: There are no findings suspicious for malignancy. Images were
processed with CAD.
IMPRESSION: No mammographic evidence of malignancy. A result letter of this
screening mammogram will be mailed directly to the patient.

RECOMMENDATION:
Screening mammogram in one year. (Code:[id number])

BI-RADS CATEGORY  1: Negative.

## 2022-06-11 NOTE — Telephone Encounter (Signed)
MR neck soft tissue [employer] auth: [id number] exp. 06/11/22-07/26/22   MR cervical spine-[employer] Auth: [id number] exp. 06/11/22-07/26/22  Sent to GI [phone]

## 2022-06-23 NOTE — Patient Instructions (Signed)
I will ask Dr. Jaynee Eagles to evaluate for neck pain.  Continue with current medications and take lipid medication regularly.  Migraine headaches have improved with Dr. Cathren Laine treatment.  Return in 1 year or as needed.  Vaccines discussed.

## 2022-06-26 ENCOUNTER — Ambulatory Visit
Admission: RE | Admit: 2022-06-26 | Discharge: 2022-06-26 | Disposition: A | Discharge status: Home / Self Care | Payer: 59 | Source: Ambulatory Visit | Attending: Neurology | Admitting: Neurology

## 2022-06-26 DIAGNOSIS — M6289 Other specified disorders of muscle: Secondary | ICD-10-CM

## 2022-06-26 DIAGNOSIS — M542 Cervicalgia: Secondary | ICD-10-CM

## 2022-06-26 DIAGNOSIS — R590 Localized enlarged lymph nodes: Secondary | ICD-10-CM

## 2022-06-26 DIAGNOSIS — R200 Anesthesia of skin: Secondary | ICD-10-CM

## 2022-06-26 DIAGNOSIS — M5412 Radiculopathy, cervical region: Secondary | ICD-10-CM

## 2022-06-26 DIAGNOSIS — K1379 Other lesions of oral mucosa: Secondary | ICD-10-CM

## 2022-06-26 DIAGNOSIS — G8929 Other chronic pain: Secondary | ICD-10-CM

## 2022-06-26 DIAGNOSIS — R221 Localized swelling, mass and lump, neck: Secondary | ICD-10-CM

## 2022-06-26 DIAGNOSIS — C760 Malignant neoplasm of head, face and neck: Secondary | ICD-10-CM

## 2022-06-26 MED ORDER — GADOPICLENOL 0.5 MMOL/ML IV SOLN
6.0000 mL | Freq: Once | INTRAVENOUS | Status: AC | PRN
  Administered 2022-06-26: 6 mL via INTRAVENOUS

## 2022-08-23 ENCOUNTER — Telehealth: Payer: Self-pay | Admitting: Neurology

## 2022-08-23 NOTE — Telephone Encounter (Signed)
Xray results mailed to pt on 08/23/22

## 2022-10-22 ENCOUNTER — Encounter: Payer: Self-pay | Admitting: Internal Medicine

## 2022-10-22 NOTE — Telephone Encounter (Signed)
LVM to CB to schedule an appointment 

## 2022-10-30 ENCOUNTER — Telehealth: Payer: Self-pay | Admitting: Internal Medicine

## 2022-10-30 NOTE — Telephone Encounter (Signed)
LVM to CB to reschedule appointment on 11/20/2022, provider out of office that week

## 2022-11-05 NOTE — Telephone Encounter (Signed)
LVM to CB to reschedule appointment 

## 2022-11-06 NOTE — Telephone Encounter (Signed)
scheduled

## 2022-11-06 NOTE — Telephone Encounter (Signed)
LVM to CB to reschedule, provider out of office

## 2022-11-20 ENCOUNTER — Ambulatory Visit: Payer: 59 | Admitting: Internal Medicine

## 2022-11-22 NOTE — Progress Notes (Signed)
Patient Care Team: Margaree Mackintosh, MD as PCP - General (Internal Medicine) Chrystie Nose, MD as PCP - Cardiology (Cardiology)  Visit Date: 11/30/22  Subjective:    Patient ID: Janet Donovan, MD , Female   DOB: Nov 23, 1970, 52 y.o.    MRN: [id number]   52 y.o. Female presents today for fatigue, insomnia. Husband passed away recently. Concerned about thyroid functions, hormone levels. Does not want to take anything for sleep or see grief counselor. Erasmo Score is this coming weekend and she may try to return to work sometime next week.  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 General health is excellent. Hx of migraine headaches seen by Dr. Lucia Gaskins. Is on low dose generic Crestor for pure hypercholesterolemia.     Review of Systems  Constitutional:  Positive for malaise/fatigue. Negative for fever.  HENT:  Negative for congestion.   Eyes:  Negative for blurred vision.  Respiratory:  Negative for cough and shortness of breath.   Cardiovascular:  Negative for chest pain, palpitations and leg swelling.  Gastrointestinal:  Negative for vomiting.  Musculoskeletal:  Negative for back pain.  Skin:  Negative for rash.  Neurological:  Negative for loss of consciousness and headaches.  Psychiatric/Behavioral:  The patient has insomnia.         Objective:   Vitals: patient not examined. Spent 20 minutes speaking with her.   Physical Exam Vitals and nursing note reviewed.   Constitutional:      General: She is not in acute distress.    Appearance: Normal appearance. She is not toxic-appearing.  HENT:     Head: Normocephalic and atraumatic.  Pulmonary:     Effort: Pulmonary effort is normal.  Skin:    General: Skin is warm and dry.  Neurological:     Mental Status: She is alert and oriented to person, place, and time. Mental status is at baseline.  Psychiatric:        Mood and Affect: Mood normal.        Behavior: Behavior normal.        Thought Content: Thought content normal.        Judgment: Judgment normal.      Results:   Studies obtained and personally reviewed by me:   Labs:       Component Value Date/Time   NA 140 05/11/2022 0949   K 4.5 05/11/2022 0949   CL 102 05/11/2022 0949   CO2 30 05/11/2022 0949   GLUCOSE 86 05/11/2022 0949   BUN 14 05/11/2022 0949   CREATININE 0.67 05/11/2022 0949   CALCIUM 9.9 05/11/2022 0949   PROT 7.4 05/11/2022 0949   ALBUMIN 4.6 01/22/2017 1038   AST 13 05/11/2022 0949   ALT 9 05/11/2022  0949   ALKPHOS 87 01/22/2017 1038   BILITOT 0.8 05/11/2022 0949   GFRNONAA 87 03/01/2020 0927   GFRAA 100 03/01/2020 0927     Lab Results  Component Value Date   WBC 5.0 05/11/2022   HGB 13.8 05/11/2022   HCT 41.2 05/11/2022   MCV 93.2 05/11/2022   PLT 344 05/11/2022    Lab Results  Component Value Date   CHOL 210 (H) 05/11/2022   HDL 73 05/11/2022   LDLCALC 123 (H) 05/11/2022   TRIG 47 05/11/2022   CHOLHDL 2.9 05/11/2022    No results found for: "HGBA1C"   Lab Results  Component Value Date   TSH 1.39 05/11/2022      Assessment & Plan:   Fatigue: Labs obtained including TSH. Await lab results for further recommendations. FSH also ordered  Grief: Condolences offered. Does not want anti-anxiety meds at this time.  Hx of migraine headaches treated with Emgality per Dr. Lucia Gaskins  Hyperlipidemia on low dose rosuvastatin. I do not think this is contributing to fatigue.   I,Alexander  Ruley,acting as a Neurosurgeon for Margaree Mackintosh, MD.,have documented all relevant documentation on the behalf of Margaree Mackintosh, MD,as directed by  Margaree Mackintosh, MD while in the presence of Margaree Mackintosh, MD.   I, Margaree Mackintosh, MD, have reviewed all documentation for this visit. The documentation on 11/30/22 for the exam, diagnosis, procedures, and orders are all accurate and complete.

## 2022-11-30 ENCOUNTER — Other Ambulatory Visit: Payer: 59

## 2022-11-30 ENCOUNTER — Encounter: Payer: Self-pay | Admitting: Internal Medicine

## 2022-11-30 ENCOUNTER — Ambulatory Visit: Payer: 59 | Admitting: Internal Medicine

## 2022-11-30 DIAGNOSIS — N951 Menopausal and female climacteric states: Secondary | ICD-10-CM

## 2022-11-30 DIAGNOSIS — Z8669 Personal history of other diseases of the nervous system and sense organs: Secondary | ICD-10-CM | POA: Diagnosis not present

## 2022-11-30 DIAGNOSIS — R5383 Other fatigue: Secondary | ICD-10-CM

## 2022-11-30 DIAGNOSIS — E7800 Pure hypercholesterolemia, unspecified: Secondary | ICD-10-CM | POA: Diagnosis not present

## 2022-11-30 NOTE — Patient Instructions (Signed)
I am truly sorry to hear about your husband's passing. Please take some time for yourself and do not return to work too quickly. Labs have been drawn today and we will contact you with results.

## 2022-12-01 LAB — CK: Total CK: 44 U/L (ref 29–143)

## 2022-12-01 LAB — IRON, TOTAL/TOTAL IRON BINDING CAP
%SAT: 44 % (calc) (ref 16–45)
TIBC: 303 mcg/dL (calc) (ref 250–450)

## 2022-12-04 LAB — COMPREHENSIVE METABOLIC PANEL
AG Ratio: 2 (calc) (ref 1.0–2.5)
ALT: 11 U/L (ref 6–29)
AST: 13 U/L (ref 10–35)
Albumin: 4.8 g/dL (ref 3.6–5.1)
Alkaline phosphatase (APISO): 102 U/L (ref 37–153)
BUN: 15 mg/dL (ref 7–25)
CO2: 29 mmol/L (ref 20–32)
Calcium: 9.6 mg/dL (ref 8.6–10.4)
Chloride: 102 mmol/L (ref 98–110)
Creat: 0.74 mg/dL (ref 0.50–1.03)
Globulin: 2.4 g/dL (calc) (ref 1.9–3.7)
Glucose, Bld: 82 mg/dL (ref 65–99)
Potassium: 4.2 mmol/L (ref 3.5–5.3)
Sodium: 140 mmol/L (ref 135–146)
Total Bilirubin: 0.7 mg/dL (ref 0.2–1.2)
Total Protein: 7.2 g/dL (ref 6.1–8.1)

## 2022-12-04 LAB — CBC WITH DIFFERENTIAL/PLATELET
Absolute Monocytes: 391 cells/uL (ref 200–950)
Basophils Absolute: 61 cells/uL (ref 0–200)
Basophils Relative: 1.1 %
Eosinophils Absolute: 369 cells/uL (ref 15–500)
Eosinophils Relative: 6.7 %
HCT: 43.4 % (ref 35.0–45.0)
Hemoglobin: 14.5 g/dL (ref 11.7–15.5)
Lymphs Abs: 1623 cells/uL (ref 850–3900)
MCH: 31.3 pg (ref 27.0–33.0)
MCHC: 33.4 g/dL (ref 32.0–36.0)
MCV: 93.5 fL (ref 80.0–100.0)
MPV: 9.8 fL (ref 7.5–12.5)
Monocytes Relative: 7.1 %
Neutro Abs: 3058 cells/uL (ref 1500–7800)
Neutrophils Relative %: 55.6 %
Platelets: 317 10*3/uL (ref 140–400)
RBC: 4.64 10*6/uL (ref 3.80–5.10)
RDW: 12.2 % (ref 11.0–15.0)
Total Lymphocyte: 29.5 %
WBC: 5.5 10*3/uL (ref 3.8–10.8)

## 2022-12-04 LAB — TSH: TSH: 1.04 mIU/L

## 2022-12-04 LAB — IRON, TOTAL/TOTAL IRON BINDING CAP: Iron: 134 ug/dL (ref 45–160)

## 2022-12-04 LAB — T4, FREE: Free T4: 1.1 ng/dL (ref 0.8–1.8)

## 2022-12-04 LAB — FOLLICLE STIMULATING HORMONE: FSH: 6.2 m[IU]/mL

## 2022-12-31 ENCOUNTER — Encounter: Payer: Self-pay | Admitting: Internal Medicine

## 2023-01-02 ENCOUNTER — Ambulatory Visit (INDEPENDENT_AMBULATORY_CARE_PROVIDER_SITE_OTHER): Payer: 59 | Admitting: Licensed Clinical Social Worker

## 2023-01-02 DIAGNOSIS — F4322 Adjustment disorder with anxiety: Secondary | ICD-10-CM
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
## 2023-01-12 ENCOUNTER — Telehealth: Payer: Self-pay | Admitting: Pharmacy Technician

## 2023-01-12 NOTE — Telephone Encounter (Signed)
Pharmacy Patient Advocate Encounter   Received notification from CoverMyMeds that prior authorization for Emgality 120MG /ML auto-injectors (migraine) is required/requested.   Insurance verification completed.   The patient is insured through [facility] .   Per test claim: PA submitted to [facility] via CoverMyMeds Key/confirmation #/EOC [id number] Status is pending

## 2023-01-13 NOTE — Telephone Encounter (Signed)
Pharmacy Patient Advocate Encounter  Received notification from [facility] that Prior Authorization for Emgality 120MG /ML auto-injectors (migraine) has been APPROVED from 01/12/2023 to 01/12/2024.Marland Kitchen  PA #/Case ID/Reference #: [id number]

## 2023-01-15 ENCOUNTER — Ambulatory Visit: Payer: 59 | Admitting: Licensed Clinical Social Worker

## 2023-01-15 DIAGNOSIS — F4322 Adjustment disorder with anxiety: Secondary | ICD-10-CM

## 2023-01-15 NOTE — Progress Notes (Signed)
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  Reported Symptoms: Depression due to grief and anxiety  Mental Status Exam: Appearance:  Well Groomed     Behavior: Appropriate  Motor: Normal  Speech/Language:  Clear and Coherent  Affect: Flat  Mood: depressed  Thought process: normal  Thought content:   WNL  Sensory/Perceptual disturbances:   WNL  Orientation: oriented to person, place, time/date, situation, day of week, month of year, and year  Attention: Good  Concentration: Good  Memory: WNL  Fund of knowledge:  Good  Insight:   Good  Judgment:  Good  Impulse Control: Good   Risk Assessment: Danger to Self:  No Self-injurious Behavior: No Danger to Others: No Duty to Warn:no Physical Aggression / Violence:No  Access to Firearms a concern: No  Gang Involvement:No   Subjective:   Janet Donovan, MD participated from office with Clinician present, and consented to treatment.   Janet Mason presented on time for her scheduled session. Patient was polite and cooperative and identified that she is trying to think more positive about the future and her past life with her husband. Patient reported going a trip this past weekend with a college friend and that it was very relaxing and much needed. We discussed the importance of doing things for herself and allowing others to be supportive during this time.    Interventions: Cognitive Behavioral Therapy  Diagnosis: No diagnosis found.   Plan: Janet Mason  is to use CBT, mindfulness and coping skills to help manage decrease symptoms associated with their diagnosis.   Long-term goal:   Janet Mason will educe overall level, frequency, and intensity of the feelings of depression, anxiety and panic evidenced by decreased negative self talk, and helpless feelings from 6 to 7  days/week to 0 to 2 days/week per client report for at least 3 consecutive months. Treatment plan will be reviewed by 01/02/2024.  Short-term goal:  Janet Mason  will verbally express understanding of the relationship between feelings of depression, anxiety and their impact on thinking patterns and behaviors. Verbalize an understanding of the role that distorted thinking plays in creating fears, excessive worry, and ruminations.  [name] MSW, LCSW DATE: 01/15/2023

## 2023-01-29 ENCOUNTER — Ambulatory Visit: Payer: 59 | Admitting: Licensed Clinical Social Worker

## 2023-01-29 DIAGNOSIS — F4322 Adjustment disorder with anxiety: Secondary | ICD-10-CM

## 2023-01-29 NOTE — Progress Notes (Signed)
 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  Reported Symptoms: Symptoms of anxiety and grief  Mental Status Exam: Appearance:  Neat     Behavior: Appropriate  Motor: Normal  Speech/Language:  Clear and Coherent  Affect: Flat  Mood: depressed  Thought process: normal  Thought content:   WNL  Sensory/Perceptual disturbances:   WNL  Orientation: oriented to person, place, time/date, situation, day of week, month of year, and year  Attention: Good  Concentration: Good  Memory: WNL  Fund of knowledge:  Good  Insight:   Good  Judgment:  Good  Impulse Control: Good   Risk Assessment: Danger to Self:  No Self-injurious Behavior: No Danger to Others: No Duty to Warn:no Physical Aggression / Violence:No  Access to Firearms a concern: No  Gang Involvement:No   Subjective:   Janet Donovan, MD participated from office located at [facility] with Clinician present. Roseanne consented to treatment.   Patient presented on time for her scheduled session. Patient was tired and appeared to be exhausted. Patient identified not sleeping well and lying in bed at night ruminating over things she needs to do. Patient was tearful at times and identified still dealing with grief and trying to navigate a new normal.  Interventions: Cognitive Behavioral Therapy and Grief Therapy  Diagnosis: No diagnosis found.   Plan: Hailly  is to use CBT, mindfulness and coping skills to help manage decrease symptoms associated with their diagnosis.Maysa will Identify one or more strategies to improve overall sleep hygiene  Identify one or more areas of sleep that are negatively impacted (sleep too much, too little, etc)     Long-term goal:   Lexey will reduce overall level, frequency, and intensity of the feelings of  depression, anxiety and poor sleep and grief evidenced by decreased irritability, negative self talk, and helpless feelings from 6 to 7 days/week to 0 to 2 days/week per client report for at least 3 consecutive months. Treatment plan to be reviewed by 01/02/2024.  Short-term goal:  Kamariona will verbally express understanding of the relationship between feelings of depression, anxiety and their impact on thinking patterns and behaviors. Verbalize an understanding of the role that distorted thinking plays in creating fears, excessive worry, and ruminations.  [name] MSW, LCSW DATE:01/29/2024

## 2023-02-11 ENCOUNTER — Ambulatory Visit: Payer: 59 | Admitting: Licensed Clinical Social Worker

## 2023-02-12 ENCOUNTER — Ambulatory Visit: Payer: 59 | Admitting: Licensed Clinical Social Worker

## 2023-02-13 LAB — HM PAP SMEAR: HPV, high-risk: NEGATIVE

## 2023-02-26 ENCOUNTER — Ambulatory Visit: Payer: 59 | Admitting: Licensed Clinical Social Worker

## 2023-02-26 DIAGNOSIS — F4322 Adjustment disorder with anxiety: Secondary | ICD-10-CM

## 2023-02-26 NOTE — Progress Notes (Signed)
[facility] Counselor/Therapist Progress Note  Patient ID: GAETANA WHEELHOUSE, MD, MRN: [id number]    Date: 02/26/23  Time Spent: 0902  am - 1000 am : 58 Minutes  Treatment Type: Individual Therapy.  Reported Symptoms: Symptoms of depression and anxiety related to recetn death of husband.  Mental Status Exam: Appearance:  Well Groomed     Behavior: Appropriate  Motor: Normal  Speech/Language:  Clear and Coherent  Affect: Depressed  Mood: sad  Thought process: normal  Thought content:   WNL  Sensory/Perceptual disturbances:   WNL  Orientation: oriented to person, place, time/date, situation, day of week, month of year, and year  Attention: Good  Concentration: Good  Memory: WNL  Fund of knowledge:  Good  Insight:   Good  Judgment:  Good  Impulse Control: Good   Risk Assessment: Danger to Self:  No Self-injurious Behavior: No Danger to Others: No Duty to Warn:no Physical Aggression / Violence:No  Access to Firearms a concern: No  Gang Involvement:No   Subjective:   Argentina Donovan, MD participated from office located at [facility] with Clinician present. Alessandra consented to treatment.   Aleyza  presented for her session on time. Patient was emotional and tearful throughout the session. Patient identified it being her husbands birthday tomorrow and not being sure how to commemorate it. Patient shared that she has done some travelling it has helped her cope with her grief. She reports that she remains anxious about what to do with her husbands ashes and how to honor him. Clinician and patient discussed not setting a timeframe but to do it in patientstime when she feels ready.   Interventions: Cognitive Behavioral Therapy and Grief Therapy  Diagnosis: Adjustment Disorder with Anxious Mood   Plan: Zyionna  is to use CBT, mindfulness and coping skills to help manage decrease symptoms associated with their diagnosis.   Long-term goal:   Io  will reduce overall level, frequency, and intensity of the feelings of depression, anxiety and grief evidenced by decreased irritability, negative self talk, and helpless feelings from 6 to 7 days/week to 0 to 2 days/week per client report for at least 3 consecutive months. Treatment plan to be reviewed by 01/02/2024.  Short-term goal:  Lakiera will verbally express understanding of the relationship between feelings of depression, anxiety and their impact on thinking patterns and behaviors. Verbalize an understanding of the role that distorted thinking plays in creating fears, excessive worry, and ruminations.  [name] MSW, LCSW DATE:02/26/2023

## 2023-03-11 ENCOUNTER — Other Ambulatory Visit: Payer: Self-pay | Admitting: Internal Medicine

## 2023-03-11 DIAGNOSIS — Z1231 Encounter for screening mammogram for malignant neoplasm of breast: Secondary | ICD-10-CM

## 2023-03-19 ENCOUNTER — Ambulatory Visit: Payer: 59 | Admitting: Licensed Clinical Social Worker

## 2023-03-19 DIAGNOSIS — F4322 Adjustment disorder with anxiety: Secondary | ICD-10-CM | POA: Diagnosis not present

## 2023-03-19 NOTE — Progress Notes (Signed)
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  Reported Symptoms: Symptoms of depression related to grief of losing spouse and how to navigate life without him.  Mental Status Exam: Appearance:  Well Groomed     Behavior: Appropriate  Motor: Normal  Speech/Language:  Clear and Coherent  Affect: Depressed  Mood: sad  Thought process: normal  Thought content:   WNL  Sensory/Perceptual disturbances:   WNL  Orientation: oriented to person, place, time/date, situation, day of week, month of year, and year  Attention: Good  Concentration: Good  Memory: WNL  Fund of knowledge:  Good  Insight:   Good  Judgment:  Good  Impulse Control: Good   Risk Assessment: Danger to Self:  No Self-injurious Behavior: No Danger to Others: No Duty to Warn:no Physical Aggression / Violence:No  Access to Firearms a concern: No  Gang Involvement:No   Subjective:   Janet Donovan, MD participated from office located at [facility] with Clinician present. Denim consented to treatment.   Patient presented for her session and identified having a difficult time determining certain things related to her life after her husbands passing. Patient reports that she plays certain things over in her head such as should she stop wearing her rings, or what to do with his things  and when to do it. Patient also identified loneliness and a desire to have someone to talk to when she is having certain thoughts and feelings. Clinician encouraged patient to call on her friends and allow them to be supportive. Patient identified that she is very private and doesn't let people all the way in. She identified a desire to be her authentic self and that she only had that with her husband. Clinician and patient processed that we all wear mask to a  certain degree and by knowing who our people are we can call on them anytime for support and allow the vulnerable side to show.   Interventions: Cognitive Behavioral Therapy, Grief Therapy  Diagnosis: Adjustment Disorder with Anxious Mood.   Plan: Cabrina is to use CBT, mindfulness and coping skills to help manage decrease symptoms associated with their diagnosis.   Long-term goal:   Freddi will reduce overall level, frequency, and intensity of the feelings of grief and  anxiety  evidenced by       decreased irritability, negative self talk, and helpless feelings from 6 to 7 days/week to 0 to 2 days/week per client report for at least 3 consecutive months.Treatment plan to be reviewed by 01/02/2024.  Short-term goal:  Annlouise will verbally express understanding of the relationship between feelings of grief and  anxiety and their impact on thinking patterns and behaviors. Verbalize an understanding of the role that distorted thinking plays in creating fears, excessive worry, and ruminations.  [name] MSW, LCSW DATE: 03/19/2023

## 2023-04-02 ENCOUNTER — Ambulatory Visit: Payer: 59 | Admitting: Licensed Clinical Social Worker

## 2023-04-02 DIAGNOSIS — F4322 Adjustment disorder with anxiety: Secondary | ICD-10-CM | POA: Diagnosis not present

## 2023-04-02 NOTE — Progress Notes (Signed)
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  Reported Symptoms: Anxiety and depression related to death of spouse  Mental Status Exam: Appearance:  Well Groomed     Behavior: Appropriate  Motor: Normal  Speech/Language:  Clear and Coherent  Affect: Appropriate  Mood: normal  Thought process: normal  Thought content:   WNL  Sensory/Perceptual disturbances:   WNL  Orientation: oriented to person, place, time/date, situation, day of week, month of year, and year  Attention: Good  Concentration: Good  Memory: WNL  Fund of knowledge:  Good  Insight:   Good  Judgment:  Good  Impulse Control: Good   Risk Assessment: Danger to Self:  No Self-injurious Behavior: No Danger to Others: No Duty to Warn:no Physical Aggression / Violence:No  Access to Firearms a concern: No  Gang Involvement:No   Subjective:   Janet Donovan, MD participated from office located at [facility] with Clinician present. Janet Mason consented to treatment.   Patient presented on time for her scheduled session.  Patient was engaged and pleasant due the session. Patient reports that she is attempting to be more social and interact with her church and friend groups. Patient was insightful that being around others can help decrease depression and the negative thoughts that creep in when being alone. Patient reports that she has stopped wearing her wedding rings. She reported that it was a big step for her and she feels good about her decision. Patient is showing improvement and her desire to interact with others is a big step in the right direction with the holiday season approaching. Clinician and patient processed how this could be very therapeutic during this time of year.  Interventions: Cognitive Behavioral Therapy  Diagnosis:  Adjustment Disorder with anxious mood   Plan: Janet Mason  is to use CBT, mindfulness and coping skills to help manage decrease symptoms associated with their diagnosis.   Long-term goal:   Janet Mason will reduce overall level, frequency, and intensity of the feelings of depression and  anxiety evidenced by       decreased irritability, negative self talk, and helpless feelings from 6 to 7 days/week to 0 to 2 days/week per client report for at least 3 consecutive months. Treatment plan to be reviewed by 01/02/2024.  Short-term goal:  Janet Mason will Verbally express understanding of the relationship between feelings of depression, anxiety and their impact on thinking patterns and behaviors. Verbalize an understanding of the role that distorted thinking plays in creating fears, excessive worry, and ruminations.  Janet Mason MSW, LCSW DATE: 04/02/2023

## 2023-04-16 ENCOUNTER — Ambulatory Visit: Payer: 59 | Admitting: Licensed Clinical Social Worker

## 2023-04-16 DIAGNOSIS — F4322 Adjustment disorder with anxiety: Secondary | ICD-10-CM

## 2023-04-16 NOTE — Progress Notes (Signed)
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  Reported Symptoms: Anxiety related to death of spouse.  Mental Status Exam: Appearance:  Well Groomed     Behavior: Appropriate  Motor: Normal  Speech/Language:  Clear and Coherent  Affect: Appropriate  Mood: normal  Thought process: normal  Thought content:   WNL  Sensory/Perceptual disturbances:   WNL  Orientation: oriented to person, place, time/date, situation, day of week, month of year, and year  Attention: Good  Concentration: Good  Memory: WNL  Fund of knowledge:  Good  Insight:   Good  Judgment:  Good  Impulse Control: Good   Risk Assessment: Danger to Self:  No Self-injurious Behavior: No Danger to Others: No Duty to Warn:no Physical Aggression / Violence:No  Access to Firearms a concern: No  Gang Involvement:No   Subjective:   Janet Donovan, MD participated from office, located at [facility] with Clinician present. Janet Mason consented to treatment.   Patient presented for her session in a positive mood. Patient discussed her desire to be more social and to work on her loneliness. Clinician and patient discussed ideas for socialization. We identified church activities, friend groups and making plans with her friends prior to the weekends. Patient was very hopeful for her future and identified that she and her girls have a trip planned to [place] in December. Clinician praised patient for utilizing coping skills  and being proactive about her grief journey.  Interventions: Cognitive Behavioral Therapy  Diagnosis: Adjustment Disorder with anxious mood   Plan: Janet Mason is to use CBT, mindfulness and coping skills to help manage decrease symptoms associated with their diagnosis.   Long-term goal:   Janet Mason will reduce overall level,  frequency, and intensity of the feelings of anxiety and loneliness evidenced by       decreased irritability, negative self talk, and helpless feelings from 6 to 7 days/week to 0 to 2 days/week per client report for at least 3 consecutive months. Treatment plan to be reviewed by 01/02/2024.  Short-term goal:  Janet Mason will verbally express understanding of the relationship between feelings of  anxiety and their impact on thinking patterns and behaviors. Verbalize an understanding of the role that distorted thinking plays in creating fears, excessive worry, and ruminations.  [name] MSW, LCSW DATE: 04/16/2023

## 2023-04-22 NOTE — Progress Notes (Unsigned)
PATIENT: Janet Donovan, Janet Mason DOB: 1970/11/26  REASON FOR VISIT: follow up HISTORY FROM: patient  Virtual Visit via Video Note  I connected with Janet Donovan, Janet Mason on 04/22/23 at  8:30 AM EDT by a video enabled telemedicine application located remotely at [facility] and verified that I am speaking with the correct person using two identifiers who was located at their own home.   I discussed the limitations of evaluation and management by telemedicine and the availability of in person appointments. The patient expressed understanding and agreed to proceed.   PATIENT: Janet Donovan, Janet Mason DOB: 1971-01-04  REASON FOR VISIT: follow up HISTORY FROM: patient  HISTORY OF PRESENT ILLNESS: Today 04/22/23:  Janet Donovan, Janet Mason is a 52 y.o. female with a history of migraine headaches. Returns today for follow-up.  She reports that her headaches have been doing well on Emgality.  She states that she had went a year with no headaches.  She states that she did have 1 in June but this was after her husband passed away.  She is happy with Emgality.  However she is concerned about being on it long-term and if it will have any effect on her heart health.  She has tried spacing out her Emgality and reports that that was working well but she did have a headache in June and then went back to taking it every 4 weeks.  She is going through menopause.    HISTORY 05/31/2022: Continued neck and shoulder pain, seeing chiropractor and massage therapist. She was told she felt something in her back. Ongoing since 2021. Under the care of pcp, chiropractor,masseuse, neurologist, had PT at [facility] for 6 months in 2022, chiropractor for 7 months this year withongoing PT, Pain is shooting and pinching from C7 radiates into the shoulders, worse with picking up heavy objects, numbness and tingling in the arms, worse weaknes in the left arm than the right, weakness in the neck and  shoulders even though she regularly does yoga. Some numbness in the hands, used to wake her up in the night and gets twitces in the hands, emg/ncs of the hands could wear CTS splints for 4-6 weeks and if not improved emg/ncs. She has some ulcers in her mouth and swollen neck lymph nodes and pain in the throat and pressure in the ear.  Left arm weakness. More fatigue. No diplopia, terrible neck tightness and scalp itching, for a while she will get up in the night and walk in the dark she has imbalance, no falls, pulled to one side, one toe on the right is numb has always been numb not progressive numbness is the top of the great toe and has bad knees and soccer. Lump left side of the upper neck.    Patient complains of symptoms per HPI as well as the following symptoms: neck pain . Pertinent negatives and positives per HPI. All others negative     04/17/2022: Doing great on Emgality. She has not had a migraine since April. Fantastic response. She was on Ajovy then switched to [employer] We recommended PT with [facility] and she reached a plateau and in May she had this extreme neck pain episode and she couldn't move. She took prednisone for a few days, she went to a chiropractor and that has helped her Dr. Christell Faith has helped tremendously. She got a new night guard and started wearing it and the PT to build up her shoulder muscles. She has felt very  positive. Also massage. She may get an aura discussed risk of stroke and magnesium in the rile of aura.     Patient complains of symptoms per HPI as well as the following symptoms: aura . Pertinent negatives and positives per HPI. All others negative     04/11/2021: Fantastic response to ajovy. Will prescribe. Maxalt and nurtec didn;t work any better than excedrin. Last time she took Ajovy was September 28th. Discussed copay card. 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  Past medical history elevated LDL, she exercises regularly and watches her diet, has a family history of heart issues, musculoskeletal pain, normal thyroid, normal vitamin D, normal CBC and c-Met Per review of her chart.   She had her first migraine in med school, always started with visual aura and then headache, she has a zig-zag lines, or it stats centrally and then spreads out, in both eyes, lasts 30 minutes and then the headache afterwards. Taking excedrin migraine right away helps. Also more frequent in the last year, she 2 in one week, she had one driving last weekend, affects her vision and a blind spot. The headache is dull throbbing, pressure, nausea, light sensitivity, she feels worse if she coughs or leans over. She had a headache for 4 days. A mirena IUD helped in the past. In 2019 she had 3 migraines, in 2021 she had 4 migraines, this year she has had 15 days of monthly headaches with >8 migraine days a month for 6 months. No medication overuse.   I reviewed Dr. Jerolyn Shin notes: She is a former patient of Dr. Darrow Bussing at the wellness center and was diagnosed with migraine with aura, improved with discontinuation of oral contraceptives.  She is here to establish care.  Unfortunately I reviewed "care everywhere" and could not find any notes from Darrow Bussing, she must of seen her at the headache wellness center and not in [place].  However she did see ophthalmology in May 2019 after an eye injury and ophthalmic exam including slit lamp, fundus exam, unremarkable, she had an abrasion of the left cornea.   Reviewed notes, labs and imaging from outside physicians, which showed:   From a thorough review of records, medications tried that can be used in migraine management include: Excedrin, ibuprofen, magnesium, topiramate, nortriptyline,propranolol contraindicated due to hypotension, imitrex, maxalt.   REVIEW OF SYSTEMS: Out of a complete 14 system review of symptoms, the  patient complains only of the following symptoms, and all other reviewed systems are negative.  ALLERGIES: No Known Allergies  HOME MEDICATIONS: Outpatient Medications Prior to Visit  Medication Sig Dispense Refill   Galcanezumab-gnlm (EMGALITY) 120 MG/ML SOAJ Inject 120 mg into the skin every 30 (thirty) days. 1 mL 12   levonorgestrel (MIRENA) 20 MCG/24HR IUD Mirena 20 mcg/24 hours (5 yrs) 52 mg intrauterine device  Take by intrauterine route.     rosuvastatin (CRESTOR) 5 MG tablet TAKE 1 TABLET(5 MG) BY MOUTH EVERY OTHER DAY 45 tablet 3   VITAMIN D, CHOLECALCIFEROL, PO Take by mouth daily. 5000IU     No facility-administered medications prior to visit.    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  PAST SURGICAL HISTORY: Past Surgical History:  Procedure Laterality Date   CESAREAN SECTION     COLONOSCOPY  01/20/2021   HERNIA REPAIR     LASIK      FAMILY HISTORY: Family History  Problem Relation Age of Onset   Migraines Mother        sinus headaches (never dx as migraines   Diabetes Mother    Breast cancer Mother 11   Heart disease Father    Breast cancer Maternal Aunt    Colon cancer Neg Hx    Colon polyps Neg Hx    Esophageal cancer Neg Hx    Rectal cancer Neg Hx    Stomach cancer Neg Hx     SOCIAL HISTORY: Social History   Socioeconomic History   Marital status: Married    Spouse name: Not on file   Number of children: Not on file   Years of education: Not on file   Highest education level: Professional school degree (e.g., Janet Mason, DDS, DVM, JD)  Occupational History   Not on file  Tobacco Use   Smoking status: Never   Smokeless tobacco: Never  Vaping Use   Vaping status: Never Used  Substance and Sexual Activity   Alcohol use: Yes    Alcohol/week: 2.0 standard drinks of alcohol    Types: 2 Glasses of wine per week     Comment: wine few times a week   Drug use: Never   Sexual activity: Yes    Birth control/protection: I.U.D.  Other Topics Concern   Not on file  Social History Narrative   Not on file   Social Determinants of Health   Financial Resource Strain: Low Risk  (11/29/2022)   Overall Financial Resource Strain (CARDIA)    Difficulty of Paying Living Expenses: Not hard at all  Food Insecurity: No Food Insecurity (11/29/2022)   Hunger Vital Sign    Worried About Running Out of Food in the Last Year: Never true    Ran Out of Food in the Last Year: Never true  Transportation Needs: No Transportation Needs (11/29/2022)   PRAPARE - Administrator, Civil Service (Medical): No    Lack of Transportation (Non-Medical): No  Physical Activity: Insufficiently Active (11/29/2022)   Exercise Vital Sign    Days of Exercise per Week: 4 days    Minutes of Exercise per Session: 30 min  Stress: Stress Concern Present (11/29/2022)   [employer] of Occupational Health - Occupational Stress Questionnaire    Feeling of Stress : To some extent  Social Connections: Moderately Integrated (11/29/2022)   Social Connection and Isolation Panel [NHANES]    Frequency of Communication with Friends and Family: More than three times a week    Frequency of Social Gatherings with Friends and Family: Twice a week    Attends Religious Services: More than 4 times per year    Active Member of [employer] or Organizations: Yes    Attends Banker Meetings: More than 4 times per year    Marital Status: Widowed  Intimate Partner Violence: Not on file      PHYSICAL EXAM Generalized: Well developed, in no acute distress   Neurological examination  Mentation: Alert oriented to time, place, history taking. Follows all commands speech and language fluent Cranial nerve II-XII: Facial symmetry noted  DIAGNOSTIC DATA (LABS, IMAGING, TESTING) - I reviewed patient records, labs, notes, testing and imaging myself where  available.  Lab Results  Component Value Date   WBC  5.5 11/30/2022   HGB 14.5 11/30/2022   HCT 43.4 11/30/2022   MCV 93.5 11/30/2022   PLT 317 11/30/2022      Component Value Date/Time   NA 140 11/30/2022 1159   K 4.2 11/30/2022 1159   CL 102 11/30/2022 1159   CO2 29 11/30/2022 1159   GLUCOSE 82 11/30/2022 1159   BUN 15 11/30/2022 1159   CREATININE 0.74 11/30/2022 1159   CALCIUM 9.6 11/30/2022 1159   PROT 7.2 11/30/2022 1159   ALBUMIN 4.6 01/22/2017 1038   AST 13 11/30/2022 1159   ALT 11 11/30/2022 1159   ALKPHOS 87 01/22/2017 1038   BILITOT 0.7 11/30/2022 1159   GFRNONAA 87 03/01/2020 0927   GFRAA 100 03/01/2020 0927   Lab Results  Component Value Date   CHOL 210 (H) 05/11/2022   HDL 73 05/11/2022   LDLCALC 123 (H) 05/11/2022   TRIG 47 05/11/2022   CHOLHDL 2.9 05/11/2022   No results found for: "HGBA1C" No results found for: "VITAMINB12" Lab Results  Component Value Date   TSH 1.04 11/30/2022      ASSESSMENT AND PLAN 52 y.o. year old female  has a past medical history of Allergy, Elevated CK, Head ache, Hyperlipidemia, Migraine, and Palpitations. here with:  1.  Migraine headaches  -Continue Emgality -Did advise that if she wants to try spacing it out every 5 to 8 weeks to see if she tolerates that she can.  Of course if she gets breakthrough headaches she can go back to taking it every 4 weeks.  In the future may be able to discontinue if she is not having frequent headaches. -Did advise that I would discuss with Dr. Lucia Gaskins about long-term cardiac effects.  However I did encourage the patient that according to up-to-date and my knowledge there does not appear to be any significant long-term cardiac effects. -Follow-up in 1 year with Dr. Gerlene Fee, MSN, NP-C 04/22/2023, 5:17 PM [facility] [address], [address] [place], [place] [address] [phone]

## 2023-04-23 ENCOUNTER — Telehealth (INDEPENDENT_AMBULATORY_CARE_PROVIDER_SITE_OTHER): Payer: 59 | Admitting: Adult Health

## 2023-04-23 DIAGNOSIS — G43709 Chronic migraine without aura, not intractable, without status migrainosus: Secondary | ICD-10-CM | POA: Diagnosis not present

## 2023-04-23 MED ORDER — EMGALITY 120 MG/ML ~~LOC~~ SOAJ
120.0000 mg | SUBCUTANEOUS | 12 refills | Status: AC
Start: 2023-04-23 — End: ?

## 2023-04-26 ENCOUNTER — Ambulatory Visit
Admission: RE | Admit: 2023-04-26 | Discharge: 2023-04-26 | Disposition: A | Discharge status: Home / Self Care | Payer: 59 | Source: Ambulatory Visit | Attending: Internal Medicine | Admitting: Internal Medicine

## 2023-04-26 DIAGNOSIS — Z1231 Encounter for screening mammogram for malignant neoplasm of breast: Secondary | ICD-10-CM

## 2023-04-30 ENCOUNTER — Ambulatory Visit: Payer: 59 | Admitting: Licensed Clinical Social Worker

## 2023-05-06 ENCOUNTER — Other Ambulatory Visit: Payer: Self-pay | Admitting: Internal Medicine

## 2023-05-14 ENCOUNTER — Ambulatory Visit: Payer: 59 | Admitting: Licensed Clinical Social Worker
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
## 2023-05-28 ENCOUNTER — Other Ambulatory Visit: Payer: 59

## 2023-05-28 DIAGNOSIS — E7800 Pure hypercholesterolemia, unspecified: Secondary | ICD-10-CM

## 2023-05-28 DIAGNOSIS — Z1329 Encounter for screening for other suspected endocrine disorder: Secondary | ICD-10-CM

## 2023-05-28 DIAGNOSIS — N951 Menopausal and female climacteric states: Secondary | ICD-10-CM

## 2023-05-28 DIAGNOSIS — Z8249 Family history of ischemic heart disease and other diseases of the circulatory system: Secondary | ICD-10-CM

## 2023-05-28 DIAGNOSIS — Z0000 Encounter for general adult medical examination without abnormal findings: Secondary | ICD-10-CM

## 2023-05-29 LAB — CBC WITH DIFFERENTIAL/PLATELET
Absolute Lymphocytes: 1949 {cells}/uL (ref 850–3900)
Absolute Monocytes: 371 {cells}/uL (ref 200–950)
Basophils Absolute: 63 {cells}/uL (ref 0–200)
Basophils Relative: 1.1 %
Eosinophils Absolute: 542 {cells}/uL — ABNORMAL HIGH (ref 15–500)
Eosinophils Relative: 9.5 %
HCT: 40.7 % (ref 35.0–45.0)
Hemoglobin: 13.3 g/dL (ref 11.7–15.5)
MCH: 30.7 pg (ref 27.0–33.0)
MCHC: 32.7 g/dL (ref 32.0–36.0)
MCV: 94 fL (ref 80.0–100.0)
MPV: 9.5 fL (ref 7.5–12.5)
Monocytes Relative: 6.5 %
Neutro Abs: 2776 {cells}/uL (ref 1500–7800)
Neutrophils Relative %: 48.7 %
Platelets: 404 10*3/uL — ABNORMAL HIGH (ref 140–400)
RBC: 4.33 10*6/uL (ref 3.80–5.10)
RDW: 11.8 % (ref 11.0–15.0)
Total Lymphocyte: 34.2 %
WBC: 5.7 10*3/uL (ref 3.8–10.8)

## 2023-05-29 LAB — COMPLETE METABOLIC PANEL WITH GFR
AG Ratio: 2 (calc) (ref 1.0–2.5)
ALT: 10 U/L (ref 6–29)
AST: 17 U/L (ref 10–35)
Albumin: 4.7 g/dL (ref 3.6–5.1)
Alkaline phosphatase (APISO): 106 U/L (ref 37–153)
BUN: 12 mg/dL (ref 7–25)
CO2: 30 mmol/L (ref 20–32)
Calcium: 9.8 mg/dL (ref 8.6–10.4)
Chloride: 100 mmol/L (ref 98–110)
Creat: 0.75 mg/dL (ref 0.50–1.03)
Globulin: 2.3 g/dL (ref 1.9–3.7)
Glucose, Bld: 89 mg/dL (ref 65–99)
Potassium: 4.3 mmol/L (ref 3.5–5.3)
Sodium: 138 mmol/L (ref 135–146)
Total Bilirubin: 0.5 mg/dL (ref 0.2–1.2)
Total Protein: 7 g/dL (ref 6.1–8.1)
eGFR: 96 mL/min/{1.73_m2} (ref >=60)

## 2023-05-29 LAB — TSH: TSH: 1.85 m[IU]/L

## 2023-05-29 LAB — LIPID PANEL
Cholesterol: 202 mg/dL — ABNORMAL HIGH (ref <200)
HDL: 74 mg/dL (ref >=50)
LDL Cholesterol (Calc): 114 mg/dL — ABNORMAL HIGH
Non-HDL Cholesterol (Calc): 128 mg/dL (ref <130)
Total CHOL/HDL Ratio: 2.7 (calc) (ref <5.0)
Triglycerides: 62 mg/dL (ref <150)

## 2023-05-31 ENCOUNTER — Encounter: Payer: Self-pay | Admitting: Internal Medicine

## 2023-05-31 ENCOUNTER — Ambulatory Visit: Payer: 59 | Admitting: Internal Medicine

## 2023-05-31 VITALS — BP 102/70 | HR 70 | Ht 64.0 in | Wt 141.0 lb

## 2023-05-31 DIAGNOSIS — F4322 Adjustment disorder with anxiety: Secondary | ICD-10-CM

## 2023-05-31 DIAGNOSIS — D126 Benign neoplasm of colon, unspecified: Secondary | ICD-10-CM

## 2023-05-31 DIAGNOSIS — E7800 Pure hypercholesterolemia, unspecified: Secondary | ICD-10-CM | POA: Diagnosis not present

## 2023-05-31 DIAGNOSIS — M25512 Pain in left shoulder: Secondary | ICD-10-CM

## 2023-05-31 DIAGNOSIS — Z0000 Encounter for general adult medical examination without abnormal findings: Secondary | ICD-10-CM | POA: Diagnosis not present

## 2023-05-31 DIAGNOSIS — R0683 Snoring: Secondary | ICD-10-CM

## 2023-05-31 DIAGNOSIS — Z8669 Personal history of other diseases of the nervous system and sense organs: Secondary | ICD-10-CM

## 2023-05-31 DIAGNOSIS — Z8249 Family history of ischemic heart disease and other diseases of the circulatory system: Secondary | ICD-10-CM | POA: Diagnosis not present

## 2023-05-31 DIAGNOSIS — N6001 Solitary cyst of right breast: Secondary | ICD-10-CM

## 2023-05-31 LAB — POCT URINALYSIS DIP (CLINITEK)
Bilirubin, UA: NEGATIVE
Blood, UA: NEGATIVE
Glucose, UA: NEGATIVE mg/dL
Ketones, POC UA: NEGATIVE mg/dL
Leukocytes, UA: NEGATIVE
Nitrite, UA: NEGATIVE
POC PROTEIN,UA: NEGATIVE
Spec Grav, UA: 1.01 (ref 1.010–1.025)
Urobilinogen, UA: 0.2 U/dL
pH, UA: 7 (ref 5.0–8.0)

## 2023-05-31 NOTE — Patient Instructions (Signed)
Referral to Dr. Fannie Knee regarding sleep apnea. Saw Dr. Ardeen Jourdain several years ago. Patient did not want to do in hospital sleep apnea testing at that time nor at this time. Discussed left shoulder pain and suggested orthopedic consultation. Has painful right breast cyst and have suggested she call GYN.

## 2023-07-03 ENCOUNTER — Other Ambulatory Visit: Payer: Self-pay | Admitting: Internal Medicine

## 2023-08-01 ENCOUNTER — Other Ambulatory Visit: Payer: Self-pay | Admitting: Internal Medicine

## 2023-08-01 NOTE — Telephone Encounter (Signed)
 Pt is requesting a refill on rosuvastatin . Pt is no longer an established pt. Last office visit was in 2021. Would Dr. Maximo Spar like to refill medication without pt making an appointment? Please address

## 2023-08-07 ENCOUNTER — Other Ambulatory Visit (HOSPITAL_COMMUNITY): Payer: Self-pay

## 2023-11-08 ENCOUNTER — Other Ambulatory Visit (HOSPITAL_BASED_OUTPATIENT_CLINIC_OR_DEPARTMENT_OTHER): Payer: Self-pay

## 2023-11-08 MED ORDER — ZOSTER VAC RECOMB ADJUVANTED 50 MCG/0.5ML IM SUSR
0.5000 mL | Freq: Once | INTRAMUSCULAR | 0 refills | Status: AC
  Filled 2023-11-08: qty 0.5, 1d supply, fill #0

## 2023-12-16 ENCOUNTER — Telehealth: Payer: Self-pay | Admitting: Pharmacist

## 2023-12-16 NOTE — Telephone Encounter (Signed)
 Pharmacy Patient Advocate Encounter  Received notification from [facility] that Prior Authorization for Emgality  120MG /ML auto-injectors (migraine) has been APPROVED from 12/16/2023 to 12/15/2024   PA #/Case ID/Reference #: [id number]

## 2023-12-16 NOTE — Telephone Encounter (Signed)
 Pharmacy Patient Advocate Encounter   Received notification from CoverMyMeds that prior authorization for Emgality  120MG /ML auto-injectors (migraine) is required/requested.   Insurance verification completed.   The patient is insured through [facility] .   Per test claim: PA required; PA submitted to above mentioned insurance via CoverMyMeds Key/confirmation #/EOC [id number] Status is pending

## 2024-01-10 ENCOUNTER — Other Ambulatory Visit (HOSPITAL_BASED_OUTPATIENT_CLINIC_OR_DEPARTMENT_OTHER): Payer: Self-pay

## 2024-01-10 MED ORDER — SHINGRIX 50 MCG/0.5ML IM SUSR
0.5000 mL | Freq: Once | INTRAMUSCULAR | 0 refills | Status: AC
  Filled 2024-01-10: qty 0.5, 1d supply, fill #0

## 2024-01-22 ENCOUNTER — Encounter (INDEPENDENT_AMBULATORY_CARE_PROVIDER_SITE_OTHER): Payer: Self-pay | Admitting: Neurology

## 2024-01-22 DIAGNOSIS — Z0289 Encounter for other administrative examinations: Secondary | ICD-10-CM | POA: Diagnosis not present

## 2024-01-29 NOTE — Telephone Encounter (Signed)
 Please see the MyChart message reply(ies) for my assessment and plan.    This patient gave consent for this Medical Advice Message and is aware that it may result in a bill to [employer], as well as the possibility of receiving a bill for a co-payment or deductible. They are an established patient, but are not seeking medical advice exclusively about a problem treated during an in person or video visit in the last seven days. I did not recommend an in person or video visit within seven days of my reply.    I spent a total of 5 minutes cumulative time within 7 days through [employer].  Anson Fret, MD

## 2024-03-12 ENCOUNTER — Other Ambulatory Visit: Payer: Self-pay | Admitting: Internal Medicine

## 2024-03-12 DIAGNOSIS — Z1231 Encounter for screening mammogram for malignant neoplasm of breast: Secondary | ICD-10-CM

## 2024-03-30 NOTE — Telephone Encounter (Signed)
 Called and left voicemail to reschedule 10/28 appointment.  If patient calls back, can be rescheduled with Dr Buck

## 2024-04-21 ENCOUNTER — Telehealth: Payer: 59 | Admitting: Neurology

## 2024-05-01 ENCOUNTER — Other Ambulatory Visit: Payer: Self-pay | Admitting: Internal Medicine

## 2024-05-01 ENCOUNTER — Ambulatory Visit
Admission: RE | Admit: 2024-05-01 | Discharge: 2024-05-01 | Disposition: A | Discharge status: Home / Self Care | Payer: Self-pay | Source: Ambulatory Visit | Attending: Internal Medicine | Admitting: Internal Medicine

## 2024-05-01 ENCOUNTER — Telehealth: Payer: Self-pay | Admitting: Internal Medicine

## 2024-05-01 DIAGNOSIS — N6320 Unspecified lump in the left breast, unspecified quadrant: Secondary | ICD-10-CM

## 2024-05-01 DIAGNOSIS — Z1231 Encounter for screening mammogram for malignant neoplasm of breast: Secondary | ICD-10-CM

## 2024-05-01 DIAGNOSIS — N63 Unspecified lump in unspecified breast: Secondary | ICD-10-CM

## 2024-05-01 NOTE — Telephone Encounter (Signed)
 Order for left diagnostic mammogram has been placed. MJB, MD

## 2024-05-05 ENCOUNTER — Other Ambulatory Visit: Payer: Self-pay | Admitting: Adult Health

## 2024-05-05 DIAGNOSIS — G43709 Chronic migraine without aura, not intractable, without status migrainosus: Secondary | ICD-10-CM

## 2024-05-07 ENCOUNTER — Telehealth: Payer: Self-pay | Admitting: Internal Medicine

## 2024-05-15 ENCOUNTER — Ambulatory Visit

## 2024-05-15 ENCOUNTER — Ambulatory Visit: Admission: RE | Admit: 2024-05-15 | Source: Ambulatory Visit

## 2024-06-01 ENCOUNTER — Other Ambulatory Visit: Payer: Self-pay

## 2024-06-01 DIAGNOSIS — E7800 Pure hypercholesterolemia, unspecified: Secondary | ICD-10-CM

## 2024-06-01 DIAGNOSIS — N951 Menopausal and female climacteric states: Secondary | ICD-10-CM

## 2024-06-01 DIAGNOSIS — N6001 Solitary cyst of right breast: Secondary | ICD-10-CM

## 2024-06-01 DIAGNOSIS — Z0000 Encounter for general adult medical examination without abnormal findings: Secondary | ICD-10-CM

## 2024-06-01 DIAGNOSIS — Z8249 Family history of ischemic heart disease and other diseases of the circulatory system: Secondary | ICD-10-CM

## 2024-06-01 DIAGNOSIS — Z1329 Encounter for screening for other suspected endocrine disorder: Secondary | ICD-10-CM

## 2024-06-02 ENCOUNTER — Other Ambulatory Visit: Payer: 59

## 2024-06-02 ENCOUNTER — Other Ambulatory Visit

## 2024-06-03 LAB — CBC WITH DIFFERENTIAL/PLATELET
Absolute Lymphocytes: 1668 {cells}/uL (ref 850–3900)
Absolute Monocytes: 352 {cells}/uL (ref 200–950)
Basophils Absolute: 51 {cells}/uL (ref 0–200)
Basophils Relative: 1 %
Eosinophils Absolute: 525 {cells}/uL — ABNORMAL HIGH (ref 15–500)
Eosinophils Relative: 10.3 %
HCT: 44 % (ref 35.9–46.0)
Hemoglobin: 14.4 g/dL (ref 11.7–15.5)
MCH: 30.7 pg (ref 27.0–33.0)
MCHC: 32.7 g/dL (ref 31.6–35.4)
MCV: 93.8 fL (ref 81.4–101.7)
MPV: 10 fL (ref 7.5–12.5)
Monocytes Relative: 6.9 %
Neutro Abs: 2504 {cells}/uL (ref 1500–7800)
Neutrophils Relative %: 49.1 %
Platelets: 299 Thousand/uL (ref 140–400)
RBC: 4.69 Million/uL (ref 3.80–5.10)
RDW: 11.8 % (ref 11.0–15.0)
Total Lymphocyte: 32.7 %
WBC: 5.1 Thousand/uL (ref 3.8–10.8)

## 2024-06-03 LAB — COMPLETE METABOLIC PANEL WITHOUT GFR
AG Ratio: 2.2 (calc) (ref 1.0–2.5)
ALT: 14 U/L (ref 6–29)
AST: 17 U/L (ref 10–35)
Albumin: 5.1 g/dL (ref 3.6–5.1)
Alkaline phosphatase (APISO): 102 U/L (ref 37–153)
BUN: 15 mg/dL (ref 7–25)
CO2: 29 mmol/L (ref 20–32)
Calcium: 9.7 mg/dL (ref 8.6–10.4)
Chloride: 103 mmol/L (ref 98–110)
Creat: 0.75 mg/dL (ref 0.50–1.03)
Globulin: 2.3 g/dL (ref 1.9–3.7)
Glucose, Bld: 89 mg/dL (ref 65–99)
Potassium: 4.6 mmol/L (ref 3.5–5.3)
Sodium: 141 mmol/L (ref 135–146)
Total Bilirubin: 0.7 mg/dL (ref 0.2–1.2)
Total Protein: 7.4 g/dL (ref 6.1–8.1)

## 2024-06-03 LAB — LIPID PANEL
Cholesterol: 188 mg/dL (ref <200)
HDL: 82 mg/dL (ref >=50)
LDL Cholesterol (Calc): 91 mg/dL
Non-HDL Cholesterol (Calc): 106 mg/dL (ref <130)
Total CHOL/HDL Ratio: 2.3 (calc) (ref <5.0)
Triglycerides: 61 mg/dL (ref <150)

## 2024-06-03 LAB — TSH: TSH: 1.72 m[IU]/L

## 2024-06-04 NOTE — Telephone Encounter (Signed)
 done

## 2024-06-05 ENCOUNTER — Encounter: Payer: 59 | Admitting: Internal Medicine

## 2024-06-11 ENCOUNTER — Telehealth: Payer: Self-pay | Admitting: Internal Medicine

## 2024-06-11 NOTE — Telephone Encounter (Signed)
 done
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
## 2024-06-19 ENCOUNTER — Encounter: Payer: Self-pay | Admitting: Internal Medicine

## 2024-06-19 ENCOUNTER — Other Ambulatory Visit: Payer: Self-pay | Admitting: Internal Medicine

## 2024-06-19 ENCOUNTER — Ambulatory Visit: Payer: Self-pay | Admitting: Internal Medicine

## 2024-06-19 ENCOUNTER — Ambulatory Visit
Admission: RE | Admit: 2024-06-19 | Discharge: 2024-06-19 | Disposition: A | Discharge status: Home / Self Care | Source: Ambulatory Visit | Attending: Internal Medicine | Admitting: Internal Medicine

## 2024-06-19 DIAGNOSIS — N63 Unspecified lump in unspecified breast: Secondary | ICD-10-CM

## 2024-06-19 DIAGNOSIS — N6310 Unspecified lump in the right breast, unspecified quadrant: Secondary | ICD-10-CM

## 2024-06-23 ENCOUNTER — Encounter: Admitting: Internal Medicine

## 2024-06-30 ENCOUNTER — Ambulatory Visit
Admission: RE | Admit: 2024-06-30 | Discharge: 2024-06-30 | Disposition: A | Discharge status: Home / Self Care | Source: Ambulatory Visit | Attending: Internal Medicine | Admitting: Internal Medicine

## 2024-06-30 DIAGNOSIS — N6310 Unspecified lump in the right breast, unspecified quadrant: Secondary | ICD-10-CM

## 2024-06-30 DIAGNOSIS — N63 Unspecified lump in unspecified breast: Secondary | ICD-10-CM

## 2024-06-30 HISTORY — PX: BREAST BIOPSY: SHX20

## 2024-07-01 LAB — SURGICAL PATHOLOGY
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
## 2024-07-10 ENCOUNTER — Ambulatory Visit: Admitting: Internal Medicine

## 2024-07-10 ENCOUNTER — Encounter: Payer: Self-pay | Admitting: Internal Medicine

## 2024-07-10 VITALS — BP 100/68 | HR 76 | Ht 64.0 in | Wt 136.0 lb

## 2024-07-10 DIAGNOSIS — G43909 Migraine, unspecified, not intractable, without status migrainosus: Secondary | ICD-10-CM

## 2024-07-10 DIAGNOSIS — E785 Hyperlipidemia, unspecified: Secondary | ICD-10-CM

## 2024-07-10 DIAGNOSIS — Z0000 Encounter for general adult medical examination without abnormal findings: Secondary | ICD-10-CM

## 2024-07-10 DIAGNOSIS — R0683 Snoring: Secondary | ICD-10-CM

## 2024-07-10 DIAGNOSIS — Z0001 Encounter for general adult medical examination with abnormal findings: Secondary | ICD-10-CM | POA: Diagnosis not present

## 2024-07-10 DIAGNOSIS — Z8669 Personal history of other diseases of the nervous system and sense organs: Secondary | ICD-10-CM

## 2024-07-10 DIAGNOSIS — E7800 Pure hypercholesterolemia, unspecified: Secondary | ICD-10-CM

## 2024-07-10 LAB — POCT URINALYSIS DIP (CLINITEK)
Bilirubin, UA: NEGATIVE
Blood, UA: NEGATIVE
Glucose, UA: NEGATIVE mg/dL
Ketones, POC UA: NEGATIVE mg/dL
Leukocytes, UA: NEGATIVE
Nitrite, UA: NEGATIVE
POC PROTEIN,UA: NEGATIVE
Spec Grav, UA: 1.025
Urobilinogen, UA: 0.2 U/dL
pH, UA: 6.5

## 2024-07-10 NOTE — Patient Instructions (Addendum)
 It was a pleasure to see you today. Return in one year or as needed. Continue current medications.

## 2024-07-21 ENCOUNTER — Encounter: Payer: Self-pay | Admitting: Internal Medicine

## 2024-09-08 ENCOUNTER — Ambulatory Visit: Admitting: Obstetrics and Gynecology

## 2025-07-09 ENCOUNTER — Other Ambulatory Visit

## 2025-07-16 ENCOUNTER — Encounter: Admitting: Internal Medicine
